# Patient Record
Sex: Female | Born: 1970 | Race: White | Hispanic: No | Smoking: Never smoker
Health system: Southern US, Community
[De-identification: ages and names within clinical notes are randomized; demographics above are authoritative.]

## PROBLEM LIST (undated history)

## (undated) DIAGNOSIS — F4310 Post-traumatic stress disorder, unspecified: Secondary | ICD-10-CM

## (undated) DIAGNOSIS — Z8709 Personal history of other diseases of the respiratory system: Secondary | ICD-10-CM

## (undated) DIAGNOSIS — J45909 Unspecified asthma, uncomplicated: Secondary | ICD-10-CM

## (undated) DIAGNOSIS — M1990 Unspecified osteoarthritis, unspecified site: Secondary | ICD-10-CM

## (undated) DIAGNOSIS — K649 Unspecified hemorrhoids: Secondary | ICD-10-CM

## (undated) DIAGNOSIS — F32A Depression, unspecified: Secondary | ICD-10-CM

## (undated) DIAGNOSIS — R238 Other skin changes: Secondary | ICD-10-CM

## (undated) DIAGNOSIS — F329 Major depressive disorder, single episode, unspecified: Secondary | ICD-10-CM

## (undated) DIAGNOSIS — M2540 Effusion, unspecified joint: Secondary | ICD-10-CM

## (undated) DIAGNOSIS — G4700 Insomnia, unspecified: Secondary | ICD-10-CM

## (undated) DIAGNOSIS — I341 Nonrheumatic mitral (valve) prolapse: Secondary | ICD-10-CM

## (undated) DIAGNOSIS — R51 Headache: Secondary | ICD-10-CM

## (undated) DIAGNOSIS — L853 Xerosis cutis: Secondary | ICD-10-CM

## (undated) DIAGNOSIS — G8929 Other chronic pain: Secondary | ICD-10-CM

## (undated) DIAGNOSIS — R42 Dizziness and giddiness: Secondary | ICD-10-CM

## (undated) DIAGNOSIS — K219 Gastro-esophageal reflux disease without esophagitis: Secondary | ICD-10-CM

## (undated) DIAGNOSIS — Z9289 Personal history of other medical treatment: Secondary | ICD-10-CM

## (undated) DIAGNOSIS — I493 Ventricular premature depolarization: Secondary | ICD-10-CM

## (undated) DIAGNOSIS — R233 Spontaneous ecchymoses: Secondary | ICD-10-CM

## (undated) DIAGNOSIS — G629 Polyneuropathy, unspecified: Secondary | ICD-10-CM

## (undated) DIAGNOSIS — M549 Dorsalgia, unspecified: Secondary | ICD-10-CM

## (undated) DIAGNOSIS — L309 Dermatitis, unspecified: Secondary | ICD-10-CM

## (undated) DIAGNOSIS — F419 Anxiety disorder, unspecified: Secondary | ICD-10-CM

## (undated) DIAGNOSIS — M810 Age-related osteoporosis without current pathological fracture: Secondary | ICD-10-CM

## (undated) HISTORY — DX: Unspecified asthma, uncomplicated: J45.909

## (undated) HISTORY — DX: Age-related osteoporosis without current pathological fracture: M81.0

## (undated) HISTORY — PX: LUMBAR LAMINECTOMY: SHX95

## (undated) HISTORY — PX: KNEE ARTHROPLASTY: SHX992

## (undated) HISTORY — PX: TOTAL SHOULDER ARTHROPLASTY: SHX126

## (undated) HISTORY — PX: BLADDER SURGERY: SHX569

## (undated) HISTORY — PX: OTHER SURGICAL HISTORY: SHX169

---

## 1990-07-21 HISTORY — PX: OTHER SURGICAL HISTORY: SHX169

## 1993-07-21 DIAGNOSIS — R000 Tachycardia, unspecified: Secondary | ICD-10-CM

## 1993-07-21 HISTORY — DX: Tachycardia, unspecified: R00.0

## 1998-07-06 ENCOUNTER — Ambulatory Visit (HOSPITAL_COMMUNITY): Admission: RE | Admit: 1998-07-06 | Discharge: 1998-07-06 | Payer: Self-pay | Admitting: Family Medicine

## 1998-07-06 ENCOUNTER — Encounter: Payer: Self-pay | Admitting: Family Medicine

## 1998-07-18 ENCOUNTER — Ambulatory Visit (HOSPITAL_COMMUNITY): Admission: RE | Admit: 1998-07-18 | Discharge: 1998-07-18 | Payer: Self-pay | Admitting: Family Medicine

## 1998-07-18 ENCOUNTER — Encounter: Payer: Self-pay | Admitting: Family Medicine

## 1999-06-14 ENCOUNTER — Ambulatory Visit (HOSPITAL_COMMUNITY): Admission: RE | Admit: 1999-06-14 | Discharge: 1999-06-14 | Payer: Self-pay | Admitting: Neurology

## 1999-06-14 ENCOUNTER — Encounter: Payer: Self-pay | Admitting: Neurology

## 1999-09-25 ENCOUNTER — Encounter: Admission: RE | Admit: 1999-09-25 | Discharge: 1999-09-25 | Payer: Self-pay | Admitting: Family Medicine

## 1999-09-25 ENCOUNTER — Encounter: Payer: Self-pay | Admitting: Family Medicine

## 1999-09-25 ENCOUNTER — Ambulatory Visit (HOSPITAL_COMMUNITY): Admission: RE | Admit: 1999-09-25 | Discharge: 1999-09-25 | Payer: Self-pay | Admitting: Family Medicine

## 1999-10-18 ENCOUNTER — Encounter: Admission: RE | Admit: 1999-10-18 | Discharge: 1999-10-18 | Payer: Self-pay | Admitting: Neurology

## 1999-10-18 ENCOUNTER — Encounter: Payer: Self-pay | Admitting: Neurology

## 2000-08-17 ENCOUNTER — Inpatient Hospital Stay (HOSPITAL_COMMUNITY): Admission: EM | Admit: 2000-08-17 | Discharge: 2000-08-20 | Payer: Self-pay | Admitting: *Deleted

## 2003-05-02 ENCOUNTER — Encounter: Admission: RE | Admit: 2003-05-02 | Discharge: 2003-05-02 | Payer: Self-pay | Admitting: Neurology

## 2003-05-02 ENCOUNTER — Encounter: Payer: Self-pay | Admitting: Radiology

## 2003-05-02 ENCOUNTER — Encounter: Payer: Self-pay | Admitting: Neurology

## 2003-05-15 ENCOUNTER — Encounter: Admission: RE | Admit: 2003-05-15 | Discharge: 2003-05-15 | Payer: Self-pay | Admitting: Neurology

## 2003-05-15 ENCOUNTER — Encounter: Payer: Self-pay | Admitting: Neurology

## 2003-05-31 ENCOUNTER — Encounter: Admission: RE | Admit: 2003-05-31 | Discharge: 2003-05-31 | Payer: Self-pay | Admitting: Neurology

## 2004-07-21 HISTORY — PX: ANTERIOR FUSION CERVICAL SPINE: SUR626

## 2004-07-30 ENCOUNTER — Ambulatory Visit (HOSPITAL_COMMUNITY): Admission: RE | Admit: 2004-07-30 | Discharge: 2004-07-30 | Payer: Self-pay | Admitting: Neurology

## 2004-08-15 ENCOUNTER — Encounter: Admission: RE | Admit: 2004-08-15 | Discharge: 2004-08-15 | Payer: Self-pay | Admitting: Neurology

## 2004-08-30 ENCOUNTER — Encounter: Admission: RE | Admit: 2004-08-30 | Discharge: 2004-08-30 | Payer: Self-pay | Admitting: Neurology

## 2004-09-06 ENCOUNTER — Ambulatory Visit: Payer: Self-pay | Admitting: Internal Medicine

## 2004-09-09 ENCOUNTER — Ambulatory Visit: Payer: Self-pay | Admitting: Internal Medicine

## 2004-12-17 ENCOUNTER — Inpatient Hospital Stay (HOSPITAL_COMMUNITY): Admission: RE | Admit: 2004-12-17 | Discharge: 2004-12-19 | Payer: Self-pay | Admitting: Specialist

## 2005-01-10 ENCOUNTER — Ambulatory Visit: Payer: Self-pay | Admitting: Internal Medicine

## 2005-04-22 ENCOUNTER — Encounter: Admission: RE | Admit: 2005-04-22 | Discharge: 2005-04-22 | Payer: Self-pay | Admitting: Specialist

## 2005-05-06 ENCOUNTER — Encounter: Admission: RE | Admit: 2005-05-06 | Discharge: 2005-05-06 | Payer: Self-pay | Admitting: Specialist

## 2005-07-21 DIAGNOSIS — Z9289 Personal history of other medical treatment: Secondary | ICD-10-CM

## 2005-07-21 HISTORY — PX: BACK SURGERY: SHX140

## 2005-07-21 HISTORY — DX: Personal history of other medical treatment: Z92.89

## 2005-09-03 ENCOUNTER — Ambulatory Visit: Payer: Self-pay | Admitting: Internal Medicine

## 2005-10-15 ENCOUNTER — Encounter: Admission: RE | Admit: 2005-10-15 | Discharge: 2005-10-15 | Payer: Self-pay | Admitting: Specialist

## 2006-01-05 ENCOUNTER — Ambulatory Visit: Payer: Self-pay | Admitting: Internal Medicine

## 2006-01-06 ENCOUNTER — Ambulatory Visit: Payer: Self-pay | Admitting: Internal Medicine

## 2006-03-11 ENCOUNTER — Encounter: Admission: RE | Admit: 2006-03-11 | Discharge: 2006-03-11 | Payer: Self-pay | Admitting: Specialist

## 2006-04-21 ENCOUNTER — Inpatient Hospital Stay (HOSPITAL_COMMUNITY): Admission: RE | Admit: 2006-04-21 | Discharge: 2006-04-25 | Payer: Self-pay | Admitting: Specialist

## 2006-06-24 ENCOUNTER — Ambulatory Visit: Payer: Self-pay | Admitting: Internal Medicine

## 2006-11-03 ENCOUNTER — Encounter: Admission: RE | Admit: 2006-11-03 | Discharge: 2006-11-03 | Payer: Self-pay | Admitting: Obstetrics and Gynecology

## 2006-11-19 ENCOUNTER — Ambulatory Visit (HOSPITAL_COMMUNITY): Admission: RE | Admit: 2006-11-19 | Discharge: 2006-11-19 | Payer: Self-pay | Admitting: Orthopedic Surgery

## 2006-11-25 ENCOUNTER — Ambulatory Visit: Payer: Self-pay | Admitting: Internal Medicine

## 2007-03-12 ENCOUNTER — Ambulatory Visit: Payer: Self-pay | Admitting: Internal Medicine

## 2007-05-14 ENCOUNTER — Ambulatory Visit: Payer: Self-pay | Admitting: Internal Medicine

## 2007-10-19 ENCOUNTER — Ambulatory Visit: Payer: Self-pay | Admitting: Internal Medicine

## 2007-10-21 ENCOUNTER — Telehealth (INDEPENDENT_AMBULATORY_CARE_PROVIDER_SITE_OTHER): Payer: Self-pay | Admitting: *Deleted

## 2007-11-04 ENCOUNTER — Encounter: Payer: Self-pay | Admitting: Internal Medicine

## 2007-11-04 DIAGNOSIS — J3089 Other allergic rhinitis: Secondary | ICD-10-CM

## 2007-11-04 DIAGNOSIS — J302 Other seasonal allergic rhinitis: Secondary | ICD-10-CM

## 2007-11-04 DIAGNOSIS — J4530 Mild persistent asthma, uncomplicated: Secondary | ICD-10-CM

## 2008-06-01 ENCOUNTER — Ambulatory Visit: Payer: Self-pay | Admitting: Internal Medicine

## 2008-06-02 ENCOUNTER — Ambulatory Visit: Payer: Self-pay | Admitting: Internal Medicine

## 2008-12-29 ENCOUNTER — Ambulatory Visit: Payer: Self-pay | Admitting: Internal Medicine

## 2009-01-05 ENCOUNTER — Telehealth: Payer: Self-pay | Admitting: Internal Medicine

## 2009-01-15 ENCOUNTER — Ambulatory Visit: Payer: Self-pay | Admitting: Internal Medicine

## 2009-01-16 ENCOUNTER — Encounter: Payer: Self-pay | Admitting: Internal Medicine

## 2009-01-25 ENCOUNTER — Telehealth (INDEPENDENT_AMBULATORY_CARE_PROVIDER_SITE_OTHER): Payer: Self-pay | Admitting: *Deleted

## 2009-01-29 ENCOUNTER — Encounter: Payer: Self-pay | Admitting: Internal Medicine

## 2009-01-30 ENCOUNTER — Ambulatory Visit: Payer: Self-pay | Admitting: Internal Medicine

## 2009-02-20 ENCOUNTER — Telehealth (INDEPENDENT_AMBULATORY_CARE_PROVIDER_SITE_OTHER): Payer: Self-pay | Admitting: *Deleted

## 2009-02-20 ENCOUNTER — Ambulatory Visit: Payer: Self-pay | Admitting: Internal Medicine

## 2009-03-15 ENCOUNTER — Telehealth (INDEPENDENT_AMBULATORY_CARE_PROVIDER_SITE_OTHER): Payer: Self-pay | Admitting: *Deleted

## 2009-03-21 ENCOUNTER — Ambulatory Visit: Payer: Self-pay | Admitting: Internal Medicine

## 2009-04-11 ENCOUNTER — Encounter: Admission: RE | Admit: 2009-04-11 | Discharge: 2009-04-11 | Payer: Self-pay | Admitting: Specialist

## 2009-05-07 ENCOUNTER — Ambulatory Visit: Payer: Self-pay | Admitting: Internal Medicine

## 2009-05-15 ENCOUNTER — Ambulatory Visit: Payer: Self-pay | Admitting: Internal Medicine

## 2009-05-25 ENCOUNTER — Encounter: Admission: RE | Admit: 2009-05-25 | Discharge: 2009-05-25 | Payer: Self-pay | Admitting: Specialist

## 2009-09-12 ENCOUNTER — Ambulatory Visit: Payer: Self-pay | Admitting: Internal Medicine

## 2009-10-22 ENCOUNTER — Encounter: Payer: Self-pay | Admitting: Internal Medicine

## 2009-11-15 ENCOUNTER — Ambulatory Visit: Payer: Self-pay | Admitting: Internal Medicine

## 2009-11-16 ENCOUNTER — Ambulatory Visit: Payer: Self-pay | Admitting: Internal Medicine

## 2009-12-07 ENCOUNTER — Encounter: Admission: RE | Admit: 2009-12-07 | Discharge: 2009-12-07 | Payer: Self-pay | Admitting: Specialist

## 2010-03-04 ENCOUNTER — Encounter: Admission: RE | Admit: 2010-03-04 | Discharge: 2010-03-04 | Payer: Self-pay | Admitting: Specialist

## 2010-06-26 ENCOUNTER — Ambulatory Visit: Payer: Self-pay | Admitting: Internal Medicine

## 2010-07-08 ENCOUNTER — Encounter
Admission: RE | Admit: 2010-07-08 | Discharge: 2010-07-08 | Payer: Self-pay | Source: Home / Self Care | Attending: Specialist | Admitting: Specialist

## 2010-07-16 ENCOUNTER — Ambulatory Visit: Payer: Self-pay | Admitting: Internal Medicine

## 2010-08-11 ENCOUNTER — Encounter: Payer: Self-pay | Admitting: Neurology

## 2010-08-20 NOTE — Miscellaneous (Signed)
Summary: Injection Record/[facility]  Injection Record/[facility]   Imported By: Sherian Rein 12/11/2009 11:39:44  _____________________________________________________________________  External Attachment:    Type:   Image     Comment:   External Document

## 2010-08-20 NOTE — Miscellaneous (Signed)
Summary: Injection Orders / [facility]    Injection Orders / [facility]    Imported By: Lennie Odor 12/18/2009 15:46:55  _____________________________________________________________________  External Attachment:    Type:   Image     Comment:   External Document

## 2010-08-20 NOTE — Miscellaneous (Signed)
Summary: Change Nasonex to Fluticasone-insurance wont cover  Clinical Lists Changes  Medications: Removed medication of NASONEX 50 MCG/ACT  SUSP (MOMETASONE FUROATE) 2 sprays each nostril as needed Added new medication of FLUTICASONE PROPIONATE 50 MCG/ACT SUSP (FLUTICASONE PROPIONATE) 1-2 sprays in each nostril up to twice a day - Signed Rx of FLUTICASONE PROPIONATE 50 MCG/ACT SUSP (FLUTICASONE PROPIONATE) 1-2 sprays in each nostril up to twice a day;  #1 x 6;  Signed;  Entered by: Reynaldo Minium CMA;  Authorized by: Waymon Budge MD;  Method used: Electronically to [facility]*, [address] [place], [place]  [id number], Ph: [phone], Fax: [phone]    Prescriptions: FLUTICASONE PROPIONATE 50 MCG/ACT SUSP (FLUTICASONE PROPIONATE) 1-2 sprays in each nostril up to twice a day  #1 x 6   Entered by:   Reynaldo Minium CMA   Authorized by:   Waymon Budge MD   Signed by:   Reynaldo Minium CMA on 10/22/2009   Method used:   Electronically to        [facility]* (retail)       [address]       [place], [place]  [id number]       Ph: [phone]       Fax: [phone]   RxID:   [id number]

## 2010-08-20 NOTE — Miscellaneous (Signed)
Summary: Injection Orders / [facility]    Injection Orders / [facility]    Imported By: Lennie Odor 12/18/2009 14:00:16  _____________________________________________________________________  External Attachment:    Type:   Image     Comment:   External Document
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
## 2010-09-30 ENCOUNTER — Other Ambulatory Visit: Payer: Self-pay | Admitting: Specialist

## 2010-09-30 DIAGNOSIS — M549 Dorsalgia, unspecified: Secondary | ICD-10-CM

## 2010-10-09 ENCOUNTER — Ambulatory Visit
Admission: RE | Admit: 2010-10-09 | Discharge: 2010-10-09 | Disposition: A | Discharge status: Home / Self Care | Payer: Medicare Other | Source: Ambulatory Visit | Attending: Specialist | Admitting: Specialist

## 2010-10-09 DIAGNOSIS — M549 Dorsalgia, unspecified: Secondary | ICD-10-CM

## 2010-12-03 NOTE — Op Note (Signed)
NAME:  Natalie Berg, Natalie Berg               ACCOUNT NO.:  [id number]   MEDICAL RECORD NO.:  [id number]          PATIENT TYPE:  AMB   LOCATION:  SDS                          FACILITY:  [facility]   PHYSICIAN:  Nadara Mustard, MD     DATE OF BIRTH:  08-06-1970   DATE OF PROCEDURE:  11/19/2006  DATE OF DISCHARGE:                               OPERATIVE REPORT   PREOPERATIVE DIAGNOSIS:  1. Right shoulder rotator cuff tear.  2. Impingement syndrome.  3. Grade 1 slap lesion.   POSTOPERATIVE DIAGNOSIS:  1. Right shoulder rotator cuff tear.  2. Impingement syndrome.  3. Grade 1 slap lesion.   PROCEDURE:  Right shoulder arthroscopy with subacromial decompression,  debridement of slap tear as well as debridement of rotator cuff tear.   SURGEON:  Nadara Mustard, M.D.   ANESTHESIA:  General plus interscalene block.   ESTIMATED BLOOD LOSS:  Minimal.   ANTIBIOTICS:  1 gram Kefzol.   DRAINS:  None.   COMPLICATIONS:  None.   DISPOSITION:  To PACU in stable condition.   INDICATIONS FOR PROCEDURE:  The patient is a 40 year old woman with  rotator cuff tear of her right shoulder.  The patient has failed  conservative care.  She has pain with activities of daily living.  MRI  scan confirms a tear, and the patient presents at this time for  arthroscopic intervention.  Risks and benefits were discussed including  infection, neurovascular injury, persistent pain, need for additional  surgery.  The patient states he understands and wishes to proceed at  this time.   DESCRIPTION OF PROCEDURE:  The patient was brought to OR Room 10 and  after undergoing interscalene block.  The patient underwent general  anesthetic.  After adequate level of anesthesia was obtained, the  patient was placed in the beach-chair position, and her right upper  extremity was prepped using DuraPrep and draped into a sterile field.  The scope was inserted through the posterior portal, and an anterior  portal was established with  outside-in technique with am 18-gauge  spinal.  Visualization showed a grade 1 slap lesion as well as partial  tearing of the rotator cuff.  These were debrided with both the shaver  and the TurboVac wand.  The patient had good attachment of the rotator  cuff along the humeral head.  After debridement, the instruments were  removed.  The scope was then inserted from the posterior portal of the  subacromial space, and a lateral portal was established.  The patient  had a very hooked type 3 acromion with very tight anterior subacromial  space.  Using the acromionizer, the patient underwent a subacromial  decompression.  The bursa was also excised.  The TurboVac wand was used  for hemostasis.  The instruments were removed.  The portals were closed  using 2-0 nylon.  The wounds were covered Adaptic orthopedic sponges,  ABD dressing and Hypafix tape.  The patient was placed in a sling,  extubated, taken to the PACU in stable condition.  Plan for discharge to  home.  Follow up in the office in  2 weeks      Nadara Mustard, MD  Electronically Signed     MVD/MEDQ  D:  11/19/2006  T:  11/19/2006  Job:  [id number]

## 2010-12-03 NOTE — Assessment & Plan Note (Signed)
[facility]                             PULMONARY OFFICE NOTE   NAME:Natalie Berg                      MRN:          [id number]  DATE:03/12/2007                            DOB:          Dec 25, 1970    PROBLEMS:  1. Allergic asthma.  2. Allergic rhinitis.   HISTORY:  One year follow up. She had lumbar fusion since last here with  no anesthesia related respiratory problems. She blames ragweed season  over the last two weeks for mild hoarseness. She tried Singulair without  noticing much benefit. She has just now resumed Nasonex. Allergy vaccine  has done well overall at 1-10 with no problems. We did look at that in  relation to her seasonal symptoms.   MEDICATIONS:  1. Advair 250/50.  2. Cymbalta 60 mg b.i.d.  3. Allergy vaccine.  4. Trazodone 50 mg from 1 to 4 nightly.  5. Flexeril 10 mg.  6. Albuterol rescue inhaler.  7. Nasonex.  8. EpiPen.  9. Claritin.  10.Ativan.   ALLERGIES:  No medication allergy.   OBJECTIVE:  Weight 134 pounds, blood pressure 116/72, pulse regular 85,  room air saturation 99%. Mild hoarseness. Throat is not red. No visible  drainage. No strider or adenopathy. Conjunctivae are clear. Chest is  clear. Heart sounds normal.   IMPRESSION:  Allergic rhinitis, mild asthma.   PLAN:  Reflux precautions, environmental precautions were again  reviewed. Try Xyzal 5 mg daily p.r.n. with discussion, comparing to over-  the-counter antihistamines. Continue allergy vaccine. Consider retesting  if necessary. Schedule return 1 year.     Clinton D. Maple Hudson, MD, Tonny Bollman, FACP  Electronically Signed   CDY/MedQ  DD: 03/14/2007  DT: 03/15/2007  Job #: [id number]   cc:   Holley Bouche, M.D.

## 2010-12-06 NOTE — Discharge Summary (Signed)
[facility]  Patient:    Natalie Berg, Natalie Berg                      MRN: [id number] Adm. Date:  [id number] Disc. Date: [id number] Attending:  Otilio Saber Dictator:   Johnella Moloney, NP                           Discharge Summary  HISTORY OF PRESENT ILLNESS:  Mrs. Natalie Berg is a 40 year old Caucasian married female admitted August 18, 2000 on a voluntary basis for depression with suicidal ideation and homicidal ideation.  Patient presents with a history of depression for years.  She reports that she has been feeling depressed, increased over the past week, feeling very tired, hopeless and sad. Contemplating suicide with a plan to cut her wrists.  She states she has never acted on it.  She also is having thoughts of hurting her husband.  They had a small argument and she was having thoughts to stab him.  She states that these thoughts were making her even more depressed.  She knew it was wrong and found that she was obsessing about this.  Sleeping poorly, has chronic back pain that has increased.  Appetite fair with weight loss, but this has been intentional weight loss.  Denies auditory or visual hallucinations, no paranoia.  Does report that she has been having other obsessive behaviors such as checking and rechecking.  She was on Paxil about a year and a half ago, with some improvement shown, but she stopped her medication on her own. Currently denies suicidal ideation.  PAST PSYCHIATRIC HISTORY:  Patient has no previous psychiatric inpatient or outpatient treatment.  PAST MEDICAL HISTORY:  Patients primary care physician is Dr. Tiburcio Pea. Medical problems include asthma, chronic back and neck pain, history of mitral valve prolapse, gastroesophageal reflux disease.  ADMISSION MEDICATIONS: 1. Prilosec 20 mg 1 p.o. q.d. 2. Advair 100/50 1 puff q.12h. 3. Albuterol 1-2 puffs q.4h. p.r.n.  DRUG ALLERGIES:  No known drug allergies.  PHYSICAL EXAM:  Basically  normal with no positive findings.  LAB WORK:  Hemogram was within normal limits.  Routine chemistry was within normal limits with exception of her glucose that was elevated at 116, and also her BUN was low at 5.  T4, T3, and TSH was within normal limits.  Her urinalysis:  Color was amber and appearance was turbid, otherwise it was within normal limits.  HOSPITAL COURSE:  Patient was admitted to [facility] unit for treatment of her depression.  We initially stated that she could use Prilosec 20 mg b.i.d., Advair 100/50 mg 1 puff q.12h., Albuterol inhaler 1-2 puffs p.r.n., Celexa 20 mg p.o. q.a.m. along with Trazodone 25 mg p.o. at h.s. and could repeat in one hour if cannot sleep.  We finally just discontinued the Trazodone and increased it to Trazodone 50 mg p.o. at h.s.  While she was here, on second day she reported feeling better.  Mood and affect were less depressed.  She reports her suicidal and obsessive thoughts of hurting her husband have decreased, sleeping somewhat better, appetite fair.  She had done well with Paxil in the past and is tolerating the Celexa well.  We decided to continue current medications.  There was a family session with the patient and her husband, and history was reviewed along with the discharge plan and patient did discuss longterm childhood stresses which may have precipitated  her symptoms.  She feels like the meds are helping and she is ready for discharge.  She is going to take a leave of absence from work.  The extended family that is living with them will be relocating in about a month and she feels like this will help her.  Strong support from her husband.  On January 31, she was much better, denying suicidal or homicidal thoughts.  She was not obsessing as she was on admission, eating and sleeping well.  Family session went quite well.  Her mood and affect were brighter, and it was felt that she could be managed on an outpatient  basis safely.  CONDITION ON DISCHARGE:   Patient is discharged in improved condition with improvement in her mood, sleep, appetite and alleviation of any suicidal or homicidal ideations or intent, improvement in her energy, tolerating her medications well, and no signs and symptoms of psychosis.  DISPOSITION:  The patient is discharged to home.  FOLLOW UP:  Patient is to follow up with Dr. Jeanie Sewer, i.e., her primary care physician, for any problems related to medical issues, and also she is going to be seeing Dr. Jennelle Human.  DISCHARGE MEDICATIONS: 1. Celexa 20 mg  1 tab q.a.m. 2. Trazodone 50 mg 1 tab h.s. 3. Prilosec 20 mg 1 tab a.m. 4. Advair 100/50 1 puff q.12h. 5. Albuterol 1-2 puffs q.4h. p.r.n. as needed. 6. Z-Pak as directed.  DISCHARGE DIAGNOSES: Axis I:    Major depressive disorder, single episode. Axis II:   Deferred. Axis III:  Asthma and chronic back pain. Axis IV:   Mild, secondary to her occupation and medical problems. Axis V:    Current global assessment of functioning 60, highest past            year 70. DD:  09/17/00 TD:  09/17/00 Job: [id number]

## 2010-12-06 NOTE — H&P (Signed)
[facility]  Patient:    Natalie Berg, Natalie Berg                      MRN: [id number] Adm. Date:  [id number] Attending:  Otilio Saber Dictator:   [place] Lorin Picket, R.N., F.N.P.                         History and Physical  REVIEW OF SYSTEMS:  Constitution:  The patient reports some recent feeling of malaise with low grade fever for about 10 days, generally feeling achy all over accompanied by nasal congestion and some occasional mild headache, no chills or night sweats.  Prior to this, the patient reports being essentially with no severe infections over the past year.  Cardiovascular: The patient reports a history of mitral valve prolapse from which she has occasional palpitations; otherwise, no particular chest pain, no history of syncope, no history of swelling in extremities.  Respiratory: The patient is a nonsmoker, currently using albuterol and Advair Diskus for asthma, reports occasional wheezing but no particular triggers noted.  The patient uses her albuterol usually not more than once daily but usually less than daily, once every two to three days.  Musculoskeletal: The patient has a history of lumbar disk disease and is status post laminectomy.  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  PHYSICAL EXAMINATION:  Young white female, casually dressed, appearing to be her stated age, well-nourished, well-developed in no distress.  Relaxed and cooperative.  Vital signs today: Temperature 99.7, pulse 101, respirations 24, blood pressure 124/76.  Skin is pale in tone and smooth.  Skin turgor is good. Skin is without rashes or remarkable markings, no tattoos.  Fine, thick strawberry blonde  hair; hair distribution normal for age and sex.  Head is normocephalic without lesions.  Eyes: Lids are without edema.  Conjunctivae and sclerae are clear.  Pupils equal, round and reactive to light.  Red reflex is noted but fundi not visualized.  ENT: External ear canals are patent and TMs have normal light reflex bilaterally.  Hearing is intact to whispered voice.  Slight facial tenderness over her left frontal sinus and her left maxillary sinus.  Nasal mucosa is pink and moist with 1+ swelling of the turbinates on the right and a moderate amount of pale yellow mucous discharge. Oral mucosa is pink and moist.  Tongue is midline without fasciculations. Uvula is midline.  Pharynx shows pale yellow mucous coating.  Neck is supple, no evidence of thyromegaly, no thyroid masses noted.  AC and PC nodes are 1+ bilaterally.  Respiratory effort is easy.  Lungs are clear to auscultation bilaterally.  Extremities are pink and warm.  Cardiovascular: S1 and S2 heard, pulses of regular rate and rhythm without clicks, murmurs, or gallops noted. Carotid pulses are 2+ bilaterally.  Peripheral pulses are 2+.  Lower extremities are without varicosities or edema.  No abdominal bruits heard. Chest is symmetrical.  Breast: Deferred.  GI: Bowel sounds present in all four quadrants, no hepatomegaly, no masses or tenderness.  Genitalia is deferred. Musculoskeletal: Upright posture, spine is straight.  Range of motion of her neck is limited in rotation to the  left.  Flexion and extension are full. Full range of motion in her upper extremities and full range of motion in her lower extremities, although she tends to guard somewhat on the range of motion in her left shoulder and complains of some tenderness of palpation of the left shoulder with range of motion movements.  Strength is 5/5 in lower extremities.  Strength is 5/5 in upper extremities but a slight motor weakness noted with the left arm both.  Grip  strength is equal bilateral extremities. Gait is a full stride; ambulatory without devices.  Neurologic: Cranial nerves II-XII are intact.  Extraocular movements are intact without nystagmus. Biceps, Achilles, and patellar reflexes are 2+/5.  Babinski is downgoing. Sensation is intact.  Motor movements are smooth without tremor.  Romberg is negative.  Cerebellar function is intact for heel-to-shin maneuvers and rapid alternating movements. DD:  08/18/00 TD:  08/18/00 Job: [id number]

## 2010-12-06 NOTE — Discharge Summary (Signed)
NAMEJAMISHA, HOESCHEN               ACCOUNT NO.:  [id number]   MEDICAL RECORD NO.:  [id number]          PATIENT TYPE:  INP   LOCATION:  5016                         FACILITY:  [facility]   PHYSICIAN:  Kerrin Champagne, M.D.   DATE OF BIRTH:  [birth year]   DATE OF ADMISSION:  04/21/2006  DATE OF DISCHARGE:  04/25/2006                                 DISCHARGE SUMMARY   ADMISSION DIAGNOSIS:  1. Degenerative disk disease, L4-5 and L5-S1.  2. Asthma.  3. Gastroesophageal reflux disease.  4. Anxiety and depression.  5. Mitral valve prolapse.  6. Chronic bronchitis.  7. Cardiac arrhythmia, probable supraventricular tachycardia.  8. Status post cervical fusion 2006   DISCHARGE DIAGNOSIS:  1. Degenerative disk disease, L4-5 and L5-S1.  2. Asthma.  3. Gastroesophageal reflux disease.  4. Anxiety and depression.  5. Mitral valve prolapse.  6. Chronic bronchitis.  7. Cardiac arrhythmia, probable supraventricular tachycardia.  8. Status post cervical fusion 2006  9. Posthemorrhagic anemia requiring blood transfusion.  10.Episode of supraventricular tachycardia requiring stay in the telemetry      unit.  Stable at discharge.   PROCEDURE:  On April 21, 2006, the patient underwent:  1. Left transforaminal lumbar interbody fusion L4-5 and L5-S1,      posterolateral fusion with Monarch pedicle screws and rods.  2. Intraoperative neural monitoring.  3. Cell saver use during the procedure.   This was performed by Dr. Otelia Sergeant, assisted by Maud Deed, [facility], under  general anesthesia.   CONSULTATIONS:  Cardiology.  She was seen by Dr. Elsie Lincoln for the cardiology  group.   BRIEF HISTORY:  The patient is a 40 year old white female with several years  history of low back pain.  She has had multiple lumbar decompressions.  She  now has bilateral leg pain and numbness.  She is unrelieved with narcotic  analgesics and has been using prednisone on a fairly regular basis to  decrease her symptoms.  She has  known to severe degenerative disk disease at  the L4-5 level and the L5-S1 level.  It was felt she would benefit from  surgical intervention and was admitted for the procedure as stated above.   BRIEF HOSPITAL COURSE:  The patient tolerated the procedure under general  anesthesia without complications.  Postoperatively, she did have a drop in  her hemoglobin to 8.1 with hematocrit 24.2.  She received 2 units of  autogenous left.  She went on to further have anemia with hemoglobin 8.4 and  hematocrit 25.1; however, this was noted to be stable at this count, and she  was started on iron supplementation as opposed to a blood transfusion.   On the first postoperative day, Hemovac drain was discontinued.  Dressing  changes were done daily thereafter, and the patient's wound was found to be  healing well.  The patient had some difficulty with constipation but was  able to have bowel movement with stool softeners.  Her diet was then able to  be increased as tolerated.   On the second postoperative day, the patient developed heart racing and  diaphoresis.  The nursing  staff assisted the patient and found her pulse  rate to be ranging from 194-200 with blood pressure 82/53.  EKG was  performed.  Cardiology was notified to see the patient.  The patient was  seen and given medications to normalize her rate.  She was transferred to  telemetry where she was monitored for the next couple of days.  It was felt  that her anemia could be exacerbating the irregularity; however, her anemia  was stabilized, and the patient did not require further blood fusion.  While  on the telemetry floor, her tachycardia essentially resolved, and she  returned to a rate around 90-100.  Otherwise, she continued to do quite  well.  Physical therapy assisted her with ambulation and gait training.  She  learned to don and doff her Aspen brace independently prior to discharge.  Occupational therapy assisted with ADLs, and she  was independent at  discharge with these as well.  PCA medications were discontinued, and she  was able to take oral analgesics without difficulty.  Neurovascular function  of the lower extremities remained intact throughout the hospital stay.  The  patient was able to void once her catheter was discontinued, and a  urinalysis was noted to be negative for urinary tract infection.  On April 25, 2006, the patient was stable for transfer to home.   PERTINENT LABORATORY VALUES:  Hemoglobin and hematocrit as stated above.  Chemistry studies on admission were within normal limits.  Postoperatively,  she had one episode of hypokalemia at 3.3 which normalized to 4.0 prior to  discharge.  Glucose ranged from 82-144.  BUN dropped to 30 postoperatively  but normalized at 8 prior to discharge.  Calcium decreased as low as 7.9 and  was noted to be 8.0 at discharge.  One round of cardiac markers were  performed secondary to her acute onset of tachycardia.  CK was noted to be  287, but troponin was within normal limits.   EKG on October 1 preoperatively showed normal sinus rhythm with premature  atrial ectopic beats.  No significant change since last tracing.  Repeat on  October 4 showed a supraventricular tachycardia, diffuse ST-T wave changes,  possibly rate related versus ischemia.  This was confirmed by Dr. Jenne Campus.   PLAN:  The patient was discharged to home.  Arrangements made for home  health physical therapy, occupational therapy and durable medical equipment.  She was encouraged to wear her brace at all times when she is out of bed.  No lifting, bending or twisting.  She will change her dressing daily and  will be allowed to shower.  She is encouraged to eat potassium-rich foods.  She will walk with assistance. No driving or lifting and increase her  activity slowly.   MEDICATIONS AT DISCHARGE: 1. Percocet 5/325 one to two every 4-6 hours as needed for pain.  2. Robaxin 500 mg one every 8  hours as needed for spasm.  3. Trinsicon one p.o. b.i.d..  4. She is advised to use over-the-counter stool softener daily and      laxatives if needed.  5. She is to avoid Advil, ibuprofen and Aleve products.   She will follow up with Dr. Otelia Sergeant 2 weeks from her surgery.  She was advised  to follow up with Dr. Alanda Amass for her tachycardia by calling his office  and arranging an appointment.   CONDITION ON DISCHARGE:  Stable.      Wende Neighbors, P.A.  Kerrin Champagne, M.D.  Electronically Signed    SMV/MEDQ  D:  05/18/2006  T:  05/18/2006  Job:  [id number]

## 2010-12-06 NOTE — Op Note (Signed)
NAMECAYLOR, CERINO               ACCOUNT NO.:  [id number]   MEDICAL RECORD NO.:  [id number]          PATIENT TYPE:  INP   LOCATION:  5006                         FACILITY:  [facility]   PHYSICIAN:  Kerrin Champagne, M.D.   DATE OF BIRTH:  February 28, 1971   DATE OF PROCEDURE:  12/17/2004  DATE OF DISCHARGE:                                 OPERATIVE REPORT   PREOPERATIVE DIAGNOSES:  This patient has central cervical canal stenosis  and right-sided canal stenosis at the C4-5 level, affecting her right C5  nerve root.  Central canal stenosis at the C5-6 level.   POSTOPERATIVE DIAGNOSES:  This patient has central cervical canal stenosis  and right-sided canal stenosis at the C4-5 level, affecting her right C5  nerve root.  Central canal stenosis at the C5-6 level.   PROCEDURE:  Anterior cervical diskectomy and fusion C4-5 and C5-6, with  decompression of bilateral C5 nerve roots and bilateral C6 nerve roots.  Right iliac crest bone graft harvest through separate incision for both  segments. Internal fixation from C4-C6, utilizing a DePuy 45 mm six-hole  locking plate and 13 mL screws.   SURGEON:  Kerrin Champagne, M.D.   ASSISTANT:  Maud Deed, [facility].   ANESTHESIA:  GOT.   ANESTHESIOLOGIST:  Maren Beach, M.D.   ESTIMATED BLOOD LOSS:  75 cc.   DRAINS:  Foley to straight drain, 10-French TLS drain anterior neck.   HISTORY OF PRESENT ILLNESS:  The patient is a  40 year old female with a  history of severe neck pain with radiation to the right arm.  This had been  present for years, worsening over the last one year. Her studies have  indicated right C5 nerve root entrapment, secondary to posterior inferior  lip osteophytes at the C4-5 level, affecting the right C5 nerve root with  central canal stenosis at this segment as well as at the C5-6 level. The  patient is brought to the operating room after attempts at conservative  management, including steroid injection treatment therapy, have  been  unsuccessful in relieving her pain. She is on high doses of Cymbalta and is  complaining of severe central and right-sided neck and arm pain.   INTRAOPERATIVE FINDINGS:  The patient was found to have central right-sided  cervical stenosis, affecting primarily the right C5 nerve root at the C4-5  level; central stenosis at C5-6 level. 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   DESCRIPTION OF PROCEDURE:  After adequate general anesthesia, placed the  patient in beach-chair position with the head held on the well-padded  [place] horseshoe in slight extension, the bump under the right buttock to  elevate the right iliac crest and the 5 pounds cervical halter traction. The  anterior neck and right iliac crest were prepped with DuraPrep solution and  draped in the usual manner;  iodine VyDrape was used.  Latex precautions  used throughout the case because of the patient's latex allergy. Latex-free  gloves were used, as were all latex-free material used during the procedure.  The patient had marking of the left side of the neck in line with the  patient's skin crease, at the expected C5 level. Also over the right iliac  crest, after sterile prep and draping, the patient underwent infiltration of  these areas using Marcaine 0.5% with 1:200,000 epinephrine;  5 cc into the  cervical spine and  5 cc over the right iliac crest.  Incision made over the  left anterior neck at the C5 level, using a 10 blade scalpel in line with  the patient's skin crease at the level of thyroid cartilage through the  subcutaneous layers. Bleeders controlled using electrocautery. The platysma  layer then incised using electrocautery. The small superficial vein was  suture ligated over the posterior aspect of the incision at lateral aspect.  Carefully the  fascial layers were then spread between the trachea and  esophagus medially, and carotid sheath laterally.  Blunt dissection then  used to carefully expose the anterior aspect of cervical spine. Prevertebral  fascia was cauterized using bipolar electrocautery and  teased across the  midline with Kittner dissector,exposing the anterior aspect of the cervical  spine.  Carotid tuberosity was palpated and this was judged to be the C6  level.  Immediate disk above this area was then marked using a spinal needle  with sheath intact, only allowing a centimeter to protrude anteriorly.  A  second needle then placed at the next level above. Intraoperative lateral  radiograph demonstrated these needles of the C4-5 level and C5-6 level.  While the intraoperative radiograph was being developed, right iliac crest  bone graft harvest site was carefully exposed.  An incision about 3-4 inches  posterior to the anterior superior iliac spine, and  incision made directly  over the superficial portion of the iliac crest anterolaterally. The  incision about 3.5-4 cm in length through skin and subcutaneous  layers down  to the iliac crest;  the abdominal fascia then carefully incised at its  intersection with the proximal thigh fascia over the iliac crest laterally,  and then subperiosteal dissection  taken over the superior aspect of the  crest and medial exposure as well as lateral exposure obtained using a Cobb  elevator. This area was then packed for later exposure and removal of graft.   After identification of the C4-5, C5-6 level, careful handheld traction  retractors were used. The lower C5-6 needle was first removed and a small  portion of the anterior aspect of the disk excised using a 15 blade scalpel,  with pituitary at the second level. The upper C4-5 level was then exposed and the needle removed at this level, and then small portion of disk excised  for careful exposure of this level and marking for  the remainder of the  case.  Carefully, the longus colli muscle was developed both on the left and  right side using subperiosteal dissection as well as electrocautery. There  was quite a bit of venous bleeding in this patient's area of her longus  colli muscle at its intersection with the vertebral body -- both sides  requiring cauterization. After this was developed, then the [facility]  retractors could be placed.  The __________  retractor was used and  articulated arm to allow for its placement in this patient's neck. Laser  then carefully placed beneath the medial border of the longus colli muscle,  and retraction obtained over the C5-6 level. Then 14  mm screw posts were  then placed into the central portion of the vertebral body of C5 and of C6,  and distraction obtained across the anterior aspect of the disk space. A 15  blade scalpel used to incise the remaining portion of the anterior annulus  fibrosis, and pituitary used to excise the disk. Curettes then used to  curettage the end plates of cartilaginous end plates, as well as the  cartilage debris. The intervertebral disk was also excised and felt to be  degenerated. A high-speed bur then used to carefully bur the endplates back  to the posterior lip osteophytes. These were carefully thinned.  Loupe  magnification headlamp was used for initial portion of this procedure, and  then the operating room microscope was carefully draped sterilely and  brought into the field under the operating room microscope.  Then the  posterior lip osteophytes were resected, as was the posterior annulus  resected and the posterior longitudinal ligament resected -- decompressing  the cervical canal centrally.  Foraminotomy performed over both C6 nerve  roots, carefully decompressing these. The height of the intervertebral disk  space and then measured,  and a #7 Green trial with a DePuy cervical set was  used; 8 mm dual oscillating saw was used to  cut the graft and the right  iliac crest. This was done, carefully retracted the medial  and lateral  structures using a dual oscillating saw to make the cut;  then dividing the  cut over its base using a 1/4-inch osteotome. This graft was then carefully  tapered to match this intervertebral disk space.   Careful inspection of disk space demonstrated no bony debris or soft tissue  remaining within the disk space, to be retropulsed with insertion of graft,  and graft was then carefully placed.  Note that the depth of the  intervertebral disk space was measured at 15 mm using a Cloward depth cage.  The graft itself measured approximately 12-13 mm, and this allowed for some  subset in the graft with its insertion impaction into place. Screw post was  then removed at both the C5 and the C6 level. Carefully the bone wax was applied to bleeding screw post holes. Hemostasis was obtained here. The  patient then had exposure of the C4-5 level by placement of the [facility] retractor, with the foot of the blade beneath the medial border  of the longus colli muscle at the C4-5 level; reinserting the blades and  this self-retaining retractor. Careful exposure here and the anterior aspect  of disk was well seen. Under the operating room microscope then 14 mm screw  posts were then replaced  at C5 level, and a second one parallel to the  first at C4 level. Distraction obtained across disk space; a 15 blade  scalpel used to incise the anterior annular fibers, and pituitary rongeur  used to excise this  3 mm; and 2 mm Kerrison used to debride anterior lip  osteophytes. End plates and disk space were then curettaged using the #2  microcurette, as well as 3-0  microcurettes. The endplates were debrided of  cartilaginous material down to bleeding bone endplates.   The dissection then carried posteriorly to the posterior lip osteophytes.  High-speed bur then used to carefully thin the posterior lip  osteophytes  transversely, as well as to carefully parallel the endplates back to  bleeding bony endplates. The 1 mm Kerrison then able  to resect the  posterior lip osteophytes, these at  their base near the more central portion  of vertebral body of C4 and C5. On the right side, particular amount of  osteophyte was found to be present compressing the right side of the thecal  sac and the cervical cord at the C5 level, just above the C5 nerve root  takeoff. This was resected, decompressing the right C5 nerve root disk  material and further fibrous material overlying the right C5 nerve root;  where it was resected using 1-2 mm Kerrison's. Additional osteophyte over  the posterior superior lip of C5 was resected out to the right side,  entering the right C5 neural foramen using one 2 mm Kerrison's and then  downbiting 3-0 microcurette. This completed decompression of the central  portions of the canal, right neural foramen as well as left neural foramen.  Foraminotomy performed over the left C5 nerve root as well.   The intervertebral disk space was carefully irrigated and hemostasis  obtained using thrombin-soaked Gelfoam appropriately.  Gelfoam was then  removed. The height of the intervertebral disk space measured to be #7,  Green sounder was used and the 8 mm dual oscillating saw used to further cut  crest on the right side. This was posterior to the previous cut, to allow  for thick crest. Again, protecting medial and lateral soft tissue structures  to the iliac crest using appropriate retractors, then the oscillating saw  was used to incise a crescent and space divided using 1/4-inch osteotome.  This was carefully tapered to dimensions of the intervertebral disk space;  again using a Theatre manager.  The depth of the intervertebral disk space measured again a 15 mm using a Cloward depth gauge. The graft was then  carefully placed over the disk space.  This required the removal of  cortex  over one of the lateral aspects of the graft, in order for the graft to be  inserted; as the mediolateral dimensions of the intervertebral disk space  were small. With this, the graft measured a depth of about 12 mm to 13 mm  and a height of 8 mm. It was keyed appropriately, the graft then inserted  and impacted into place and subset appropriately. Screw posts were then  removed at C4 and C5 levels. Then 5 pounds cervical halter traction was  released. The patient had bone wax applied to bleeding screw post holes, at  both the C4-C5 level for hemostasis purposes. Carefully the height of the  expected length of the plate was then chosen, using bone wax coated on  cottonoid string applied across the anterior aspect cervical spine from the  lower third of the vertebral bodies,  C4 to the upper third of C6. This  measured about 40 mm as the expected length between screw holes. A 45 mm  plate was chosen.  This was carefully positioned over the anterior aspect of  the cervical spine. It was felt to be rocking slightly, so the high-speed  bur was then used carefully to further remove anterior lip osteophytes of  the C4 and C5 intervertebral disk space level anteriorly, as well as C5-6  anteriorly.   Irrigation was performed. Hemostasis obtained again using electrocautery and  bone wax as needed. The plate was then carefully positioned again along the  anterior aspect of the cervical spine at the patient's chin to midline. This  was then carefully pinned into place, using retaining pins at the upper and  lower level.  First screws placed were at the C5 level  of the left side and  right side, using 13 mm screws.  A 12 mm drill bit was used to drill, using  the centralizer and sleeve protector. With a positive stop, this only  allowed 12 mm to be inserted with the drill. Then a 13 mm screw was able to  be placed on the left side similarly; then drilling with a 12 mm and placing  a 13 mm  screw on the right side at C5. Retaining pins were then removed at  the C4 and at the C6 level. Then at the C4 level 13 mm screws were placed,  first the left side then the right side; again, drilling with the 12 mm  drill and placing appropriate screw left side and then the right side. Then  at the C6 level left side and then right side drilling of 12 mm drill and  then placing 13 mm screws.   This completed fixation with the plates and screws. The final locking nuts  were then each turned, using the appropriate flat screwdriver. These locked  the plate to the screws at each segment. Intraoperative lateral radiograph  demonstrated plates and screws in good position and alignment. No evidence  of retropulsion of the screws or bone graft material noted. Appeared to be  excellent fixation of the C4 through C6 levels. Irrigation was performed in the anterior aspect of the neck. Careful inspection of the esophagus  demonstrated no abnormalities.  A 10-French TLS drain was then carefully  placed in the depth of the wound, exiting out over the anterior aspect of  cervical spine through a separate incision; this then sewn in place with a 4-  0 nylon suture. The left neck incision after careful inspection was then  closed by approximating platysma layer with interrupted 3-0 Vicryl sutures,  the subcutaneous layers with interrupted 3-0 Vicryl sutures and skin closed  with a running subcutaneous  stitch of 4-0 Vicryl.  Tincture of benzoin and  Steri-Strips applied.  At the right iliac crest bone graft harvest site;  Careful hemostasis with bone wax applied to bleeding cancellus bone  surfaces.  Soft tissue cauterized and were necessary to control small  bleeders. The fascial layer of the abdomen then approximated to the upper  thigh fascia with interrupted #1 Vicryl sutures.  The deep subcutaneous  layers approximated with interrupted 0-0 and 2-0 Vicryl suture, and the skin  closed with a running  subcutaneous stitch of 4-0 Vicryl; Tincture of benzoin  and Steri-Strips applied here. Then 4x4s applied to the skin over the right  iliac crest and anterior neck.  Then Hypafix tape used to affix these to the  skin.  The patient's TLS drain was charged with a red top tube. A  [place] collar was applied. The patient was then reactivated,  extubated, returned to recovery room in satisfactory condition. All  instrument and sponge counts were correct.       JEN/MEDQ  D:  12/17/2004  T:  12/17/2004  Job:  [id number]

## 2010-12-06 NOTE — Discharge Summary (Signed)
[facility]  Patient:    Natalie Berg, Natalie Berg                      MRN: [id number] Adm. Date:  [id number] Disc. Date: [id number] Attending:  Otilio Saber Dictator:   Johnella Moloney, N.P.                           Discharge Summary  NO DICTATION. DD:  09/17/00 TD:  09/17/00 Job: [id number]

## 2010-12-06 NOTE — Op Note (Signed)
Natalie Berg, Natalie Berg               ACCOUNT NO.:  [id number]   MEDICAL RECORD NO.:  [id number]          PATIENT TYPE:  INP   LOCATION:  5003                         FACILITY:  [facility]   PHYSICIAN:  Kerrin Champagne, M.D.   DATE OF BIRTH:  08/06/1970   DATE OF PROCEDURE:  04/21/2006  DATE OF DISCHARGE:                                 OPERATIVE REPORT   PROCEDURES:  1. Left transforaminal lumbar interbody fusion, L4-5 and L5-S1 with 7-mm      lordotic DePuy Leopard cages with local bone graft.  2. Posterolateral fusion, L4-5 and L5-S1, with local and Symphony bone      graft material and internal fixation, L4 to sacrum with Monarch pedicle      screws and rods.  3. Intraoperative neural monitoring.  4. Cell Saver used during the case.   SURGEON:  Kerrin Champagne, M.D.   ASSISTANT:  Wende Neighbors, P.A.-C.   ANESTHESIA:  General via orotracheal intubation, Dr. Arta Bruce,  supplemented with local infiltration with Marcaine 0.5% with 1:200,000  epinephrine, 20 cc.   SPECIMENS:  None.   ESTIMATED BLOOD LOSS:  250 cc.   COMPLICATIONS:  None.   NEURAL MONITORING:  The patient had intraoperative neural monitoring  performed, the right-sided L4 screw at 21, the right L5 at 25, the right S1  at 30 or greater, the left at 22 at L4, at the left L5, 25, and at the left  S1, greater than 30.  The patient did have some slight increased potentials  in the left anterior tibialis with placement of cages; however, neural  monitoring indicated that this returned to normal before the end of the  case.   INTRAOPERATIVE FINDINGS:  Severe degenerative disk disease with disk space  collapse of both L4-5 and L5-S1.  She underwent removal of the disk material  from the L4-5 level and L5-S1 level from the left side, with placement of  TLIF cages.   DESCRIPTION OF PROCEDURE:  After adequate general anesthesia, the patient  had a Foley catheter placed.  Standard preoperative antibiotics.  Care  taken, as the patient was known to have Latex-contact allergy.  She was  placed in a prone position on chest rolls, pillows under the thighs and  legs, well padded.  TED hose and PAS stockings.  The arms at the sides at 90  degrees from the body and at the elbow to prevent any significant nerve  traction.  She had blood drawn for purposes of Symphony bone graft material  prior to turning to prone.  She underwent a standard prep with DuraPrep  solution from the mid-dorsal level to the mid-sacral left ventricle and was  then draped in the usual manner.  Iodine Vi-Drape was used.  The old  incisional scar that she had was marked out and ellipsed at the beginning of  the case following infiltration of the skin with subcu Marcaine with  1:200,000 epinephrine; 20 cc used.  The incision then made down to the  lumbodorsal fascia.  The incision then made on both sides of the spinous  processes,  extending from L2 and L3 to L4, L5, S1 and then S2 in the midline  on both sides of the spinous processes, preserving the spinous processes and  interspinous ligaments.   A Cobb was used to elevate the paralumbar muscles, both sides preserving the  facet capsules at the L3-4 level, and the L4-5 facet capsules were resected,  as well as at L5-S1 bilaterally.  An incision was then made out laterally,  exposing the transverse processes of L4 at the L3-4 level, L5 at L4-5 and  the sacral ala bilaterally.  Electrocautery used to control bleeders.   Paralumbar muscles were debrided, where they were felt to be devascularized,  and also to allow for making of a pocket for the placement of bone graft  material between the transverse processes at L4, L5 and S1 and the sacral  ala.  Again, bipolar electrocautery was used to control bleeders, as well as  monopolar.  Each of the transverse processes were well defined at each  segment, packed with sponges on both sides.  An osteotome was then used to  resect the  left-sided inferior articular process of L4 and L5.  This was  done after first performing a C-arm fluoro view lateral, demonstrating the  L5-S1 and L4-5 disk spaces and clamps on both the spinous processes of L5  and L4.  These were marked with a small removal of bone from the posterior  aspect of the spinous processes of L5 and L4 with a bare rongeur.  Using the  osteotome, then resection was performed of the inferior articular processes  of L4 and L5 on the left side, removing a small portion of the inferior  aspect of the lamina on the left side at L4 and L5.  A 3-mm Kerrison was  used to further resect upward the lamina until the insertion of the  ligamentum flavum was encountered and detached from the laminae at L4 and  L5's inferior aspect of the lamina.  Osteotomes were then used to  osteotomize the medial facet at the L4-5 facet on the left side, the  superior articular process of L5 and the superior articular process of S1.  Then, Kerrisons were used to remove this, along with ligamentum flavum,  performing foraminotomy over the left side at the L5 and S1 nerve roots, and  then foraminotomy at the left L4 neural foramina and L5 neural foramina,  removing the superior articular processes of L5 and S1 on the left side back  to the superior aspect of the pedicles to allow for easier exposure of the  disk space and the posterolateral corner for the TLIF procedure.  Bleeders  were controlled using bipolar electrocautery in this area.  Gelfoam,  thrombin soaked, placed.  The C-arm fluoro, which had been draped sterilely  were brought into the field initially for localization and used to help  localize for placement of the awl into the lateral aspect of the pedicle at  the intersection of the transverse process of L4 with the superior articular  process of L4 on the left side initially.  A hand-held pedicle finder was then used to probe the pedicle on the left side to 40 mm.  This was  then  tapped.  At each step, a ball-tipped probe was used to probe the pedicle  into the pedicle, insuring patency and no evidence of broaching of cortex.  Next, the pedicle at the L5 level was localized using an awl for an entry  point, using  the C-arm fluoro in lateral position to ascertain the correct  position and alignment of the awl at the intersection of the transverse  process of L5 with the lateral aspect of the pedicle and the superior  articular process of L5.  A straight pedicle probe was then used to probe  the pedicle at 40 mm as well.  Then, at the S1 level on the left side, an  opening made into the area just inferior to the superior articular process  of S1 in the dimple that is there and just lateral to this area.  An awl was  then used to make an initial opening.  C-arm fluoro was again used to  ascertain correct position and alignment of this, and then the pedicle was  probed at the S1 level, using the straight awl to 35 mm.  Tapping was then  performed to a 6.25 tap at each segment, again, probing the pedicle  following tapping to insure patency of the pedicle and no broaching of  cortex present.  None was found to be present.  Decortication was then  carried out over the transverse process on the left side of L4, L5 and the  sacral ala on the left side.  Bone graft with Symphony bone graft was then  applied, and then 7 x 40-mm screws were placed on the left side at L4, the  left side at L5, and then at the S1 level a 35-mm x 7 screw was placed.  Each of these obtained excellent purchase.  They were placed in the correct  degree of lordosis, as well as convergence, and observed on the C-arm  fluoro.  Each of the fasteners to these screws were then loosened using the  fastener loosener provided.  Attention was then turned to the right side,  where, again, the process of pedicle screw placement was performed.  The  area of the insertion of the transverse process of L4  with the superior  articular process of L4 and the lateral aspect of the pedicle probed, using  an awl for an entry point and C-arm fluoro to ascertain the correct position  and alignment of this.  The hand-held pedicle finder was then used to probe  the pedicle without difficulty.  The ball-tipped probe was used to probe the  pedicle to insure patency and no evidence of broaching of cortex.  It  measured 40 mm in depth, and tapping with a 6.25 tap, decorticating the  transverse process.  Bone graft was then applied to the transverse process  and a 7 x 40-mm screw was then placed on the right side at L4.  The correct  degree of convergence and lordosis were obeyed.  The C-arm fluoro indicated  a well-placed pedicle screw on the right at L4.  Next, at the L5 level on  the right side, an awl was again used to make an entry point into the  lateral aspect of the pedicle at the intersection of the transverse process of L5 with the superior articular process of L5 on the right side.  It was  observed on C-arm fluoro to be in good position and alignment.  Hand-held  pedicle probe was then used to probe the pedicle.  A ball-tipped probe was  then used to probe the pedicle channel to insure patency of the channel and  to insure that there was in broaching of cortex over the vertebral body, as  well as within the pedicle, and none was present.  Tapping performed with a  6.25 tap, the correct degree of convergence and lordosis maintained.  The  transverse process of L5 was then carefully decorticated with a high-speed  bur.  Symphony bone graft placed over this area.  Then, a 7 x 40-mm screw  placed into the pedicle on the right side at L5 without difficulty.  Next,  at the right S1 level, an awl was then placed at the inferior aspect of the  superior articular process of S1 at the dimple present just below the  superior articular process here and lateral to the process.  Then, using the  awl, the  C-arm fluoro was used to ascertain correct position and alignment.  With this, then, the S1 pedicle was probed using a straight probe to 35 mm.  Tapping was then performed using a 6.25 tap, probing the pedicle with a ball-  tipped probe to insure patency and no sign of loss or broaching of cortex  present.  Next, decortication was carried out over the lateral aspect of the  ala at the S1 level on the right side.  Symphony bone graft was then placed  over the outer side of the ala on the right side, and then between the  transverse processes at L4-L5 and L5-S1.  The 35-mm x 7 screw was then  placed on the right side at the S1 level with the correct degree of  convergence and maintaining lordosis.  The fasteners to the heads were each  loosened appropriately on the right side.  Pedicle screw testing was then  carried out using the neural monitoring provided.  Each of the resistances  tested were greater than 20 at all the screws and greater than 30 at the  sacral screws bilaterally, indicating adequate maintenance of the soft  tissue resistance and suggesting there was no evidence of broaching of  cortex present.  With this, then, a 65-mm in length rod was then placed into  the screw fasteners on the right side.  Each of the fastener caps were then  placed on the right side at L4, L5 and S1 without difficulty.  The rod was  felt to be at the limits of correct length, so that the left-sided rod was  not placed until the end of the case.  Attention, then, turned to placing  and performing TLIF on the left side.  The thecal sac and the L5 nerve root  were then carefully retracted at the L5-S1 level on the left side using  bipolar electrocautery to control bleeders and epidural veins on the left  side.  The disk space was entered using a 15 blade scalpel.  An osteotome  was then used to resect a small portion of the bone over the posterior aspect of the disk space of the posterior superior aspect  of S1 to allow for  entry into the disk using a 1/4-inch osteotome, carefully protecting the  nerve roots.  Pituitary rongeurs were then able to enter the disk space and  remove disk material using straight and upbiting pituitaries with teeth.  The disk was then dilated to 7 mm, and then to 8 mm.  The disk space was  then able to be curettaged of degenerative disk material and the endplates  curetted of cartilaginous endplate material down to bleeding bone surfaces.  The patient had very little disk remaining at this segment, or at the L4-5  level.  Curettage of the endplates was carried out using the straight curet,  the upbiting to the right and the upbiting to the left curets, debriding the  endplates down to bleeding bone surfaces.  Additionally, a straight ring  curet and the forward-biting ring curet were used to debride the disk space  further at the S1 level.  Pituitary rongeurs were used to debride the disk  space of any remaining disk material.  Sounding the disk, and then trialing  with a 7-mm trial, then an 8-mm trial.  A 7-mm lordotic cage was chosen.  The disk space was felt to be well decorticated at this point.  Bleeders  were again controlled using bipolar electrocautery.  The disk space had  local bone graft placed within the disk space via the opening into the disk  on the left side.  This was impacted into place using a 7-mm trial cage.  Once this was completed, then the 7-mm lordotic cage packed with local bone  graft was then inserted into the opening of the disk space on the left side  and impacted downward and anteriorly, and then using the kicker impactors,  into a transverse position as best as possible.  C-arm fluoro indicated the  cage in good position and alignment on the lateral views, subset beneath the  posterior aspect of the disk space at the L5-S1 level.  Bleeders again  controlled using bipolar electrocautery and Gelfoam, thrombin soaked, placed  into the  canal on the left side, lateral recess, controlling bleeding here.  Attention then turned to the L4-5 level, where the thecal sac at this level,  including the L5 nerve root, were retracted medially.  The L4 nerve root  within the neural foramen was able to be retracted superiorly.  The disk  space was entered on the left side just above the pedicle, medial to the  pedicle, using a 15-blade scalpel.  Pituitary rongeurs were used to debride  the disk of residual disk material present.  Not much was present.  A 3-mm  Kerrison was used to additionally debride annular material from the edges of  the diskotomy on the left side.  Continued disk resection was performed  using pituitary rongeurs, upbiting and straight.  Dilation was then carried  out at the disk space using a 7-mm dilator, then an 8-mm dilator.  The disk  space was then debrided of residual degenerative disk material using the pituitary rongeurs provided.  Then, the cartilaginous endplate material was  debrided using the straight and then upbiting right and upbiting left  curets, as well as ring curets down to bleeding bone endplate surfaces.  Once the disk space had been debrided of much disk material and endplate  material as possible, curettage indicated there was no further endplate  cartilage remaining.  Irrigation was performed.  A trials using 7 mm and 8  mm were carried out.  The 7, again, provided the best fit.  A 7-mm lordotic  cage was filled with local bone graft.  Additional local bone graft was  placed within the intervertebral disk space, the laminectomy and diskectomy  on the left side.  This was impacted into place with a trial 7-mm cage, and  then the permanent cage, a 7-mm lordotic cage, was then placed into the disk  space and impacted into place forward, and then carefully rotated using  kicker impactors into a transverse position and alignment.  With this then,  the nerve roots on the left side at L4 and L5 and  S1 were noted to be  exiting  without compression.  There was no residual bone graft on the left  side within the neural foramina or within the spinal canal noted.  Bleeders  were controlled using bipolar electrocautery and thrombin-soaked Gelfoam on  the left side at L4 and L5.  With this, then, on the left side, a 65-mm  length rod was then placed within the fasteners from L4 to the sacrum, and  they were carefully then attached to the fastener at the L4 level with a  very small amount of the rod above the fasteners as appropriate.  This was  then tightened with the fastener down to the rod to 80 foot pounds, with  application of the caps at each level.  Compression obtained between the  fasteners at L4 and L5, and the fastener at L5 was then tightened.  The cap  was tightened to the rod to 80 foot pounds.  And on the left side between  the L5 and the S1 level, then compression was obtained and the fastener cap  at the S1 level was then tightened to 80 foot pounds.  Similarly, this was  done on the right side, first attaching the rod to the fastener at the L4  level and obtaining correct torquing to 80 foot pounds.  Compression between  the L4 and L5 fasteners on the right side at L4 and L5 and tightening the  cap at the L5 level to 80 foot pounds.  Compression obtained, then, between  the L5 and S1 fasteners on the right side and the fastener at S1 was then  tightened to 80 foot pounds.  Intraoperative C-arm fluoro then observed to  demonstrate that the cage at the L4-5 level was quite well centralized.  That the L5-S1 level was felt to be primarily on the left side and posterior  in position such that we felt that we should rerotate this cage into a  better transverse position.  The fastener caps at the S1 level were then  carefully loosened, the caps maintaining their position.  With some slight  distraction across the lamina using a lamina spreader between the lamina of L5 and S1,  kicker impactors were then used to kick the cage into a more  transverse position and to obtain further depth at the L5-S1 level.  Once  this was completed, then the L5 nerve root was noted to be exiting without  difficulty.  It had previously been exiting as well, but care was taken to  insure that this was the case after having performed a relocating of the  lordotic cage at the L5-S1 level.  Once this was completed, then compression  was obtained between the fasteners on the left side at L5 and S1 and the cap  to the S1 level was then carefully tightened to 80 foot pounds.  Returning  to the right side, then, again compression obtained between the fasteners of  L5 and S1 and the cap to S1 was then tightened to 80 foot pounds.  Permanent  C-arm images were obtained in A/P and lateral planes, documenting the  pedicle screws and rods in excellent position and alignment and the cage at  the L4-5 level quite well centralized.  That at the L5-S1 level felt to be  more localized to the left side, however, in much better position and  alignment than seen previously.  Irrigation was then performed.  The  remaining bone graft was then placed over the posterolateral region on both  sides.  Careful inspection of the nerve roots on the left side at L4, L5 and  S1 demonstrated no abnormality, no signs of compression, no residual bone  material within the spinal canal.  Platelet-poor plasma was then sprayed  over the soft tissue margin s bilaterally.  The soft tissue was then allowed  to fall back into place.  A medium Hemovac drain was placed on the left side  along the left posterior aspect of the spine, exiting over the left lower  lumbar spine.  The lumbodorsal fascia was then reapproximated to the  interspinous ligaments and spinous processes, extending from L2 to S2.  This  was done using interrupted #1 Vicryl sutures in a simple and figure-of-eight  fashion.  Deep subcu layers were approximated  with interrupted 0 Vicryl  sutures, the more superficial layers with interrupted 2-0 Vicryl sutures,  the skin closed with a running subcuticular stitch of 4-0 Vicryl.  Tincture  of benzoin and Steri-Strips applied, 4 x 4's,  ABD pads, fixed to the skin with Hyperfix tape.  The patient was then  returned to a supine position, reactivated, extubated and returned to the  recovery room in satisfactory condition.  All instrument and sponge counts  were correct.      Kerrin Champagne, M.D.  Electronically Signed     JEN/MEDQ  D:  04/21/2006  T:  04/21/2006  Job:  [id number]
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
## 2010-12-12 ENCOUNTER — Encounter: Payer: Self-pay | Admitting: Internal Medicine

## 2010-12-13 ENCOUNTER — Other Ambulatory Visit: Payer: Self-pay | Admitting: Specialist

## 2010-12-13 DIAGNOSIS — M549 Dorsalgia, unspecified: Secondary | ICD-10-CM

## 2010-12-25 ENCOUNTER — Encounter: Payer: Self-pay | Admitting: Internal Medicine

## 2010-12-25 ENCOUNTER — Ambulatory Visit (INDEPENDENT_AMBULATORY_CARE_PROVIDER_SITE_OTHER): Payer: Medicare Other | Admitting: Internal Medicine

## 2010-12-25 VITALS — BP 118/72 | HR 78 | Ht 67.0 in | Wt 129.6 lb

## 2010-12-25 DIAGNOSIS — J309 Allergic rhinitis, unspecified: Secondary | ICD-10-CM

## 2010-12-25 DIAGNOSIS — J45909 Unspecified asthma, uncomplicated: Secondary | ICD-10-CM

## 2010-12-25 NOTE — Assessment & Plan Note (Signed)
Good control

## 2010-12-25 NOTE — Patient Instructions (Signed)
Continue present treatment
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
## 2010-12-25 NOTE — Assessment & Plan Note (Addendum)
Good now after seasonal flare in the spring,  Will continue vaccine

## 2011-01-01 ENCOUNTER — Ambulatory Visit
Admission: RE | Admit: 2011-01-01 | Discharge: 2011-01-01 | Disposition: A | Discharge status: Home / Self Care | Payer: Medicare Other | Source: Ambulatory Visit | Attending: Specialist | Admitting: Specialist

## 2011-01-01 DIAGNOSIS — M549 Dorsalgia, unspecified: Secondary | ICD-10-CM

## 2011-02-19 ENCOUNTER — Other Ambulatory Visit: Payer: Self-pay | Admitting: Internal Medicine

## 2011-02-21 ENCOUNTER — Telehealth: Payer: Self-pay | Admitting: Internal Medicine

## 2011-02-21 MED ORDER — FLUTICASONE PROPIONATE 50 MCG/ACT NA SUSP: 2.0000 | Freq: Two times a day (BID) | NASAL | Status: DC | PRN

## 2011-02-21 NOTE — Telephone Encounter (Signed)
rx for the flonase has been sent to the pharmacy for the pt.  Called and  Spoke with pts mother and she is aware that rx has been sent to the pharmacy

## 2011-03-21 ENCOUNTER — Other Ambulatory Visit: Payer: Self-pay | Admitting: Specialist

## 2011-03-21 DIAGNOSIS — Z981 Arthrodesis status: Secondary | ICD-10-CM

## 2011-03-21 DIAGNOSIS — M5430 Sciatica, unspecified side: Secondary | ICD-10-CM

## 2011-03-26 ENCOUNTER — Ambulatory Visit
Admission: RE | Admit: 2011-03-26 | Discharge: 2011-03-26 | Disposition: A | Discharge status: Home / Self Care | Payer: Medicare Other | Source: Ambulatory Visit | Attending: Specialist | Admitting: Specialist

## 2011-03-26 VITALS — BP 106/63 | HR 86

## 2011-03-26 DIAGNOSIS — Z981 Arthrodesis status: Secondary | ICD-10-CM

## 2011-03-26 DIAGNOSIS — M5430 Sciatica, unspecified side: Secondary | ICD-10-CM

## 2011-03-26 MED ORDER — IOHEXOL 180 MG/ML  SOLN
1.0000 mL | Freq: Once | INTRAMUSCULAR | Status: AC | PRN
  Administered 2011-03-26: 1 mL via EPIDURAL

## 2011-03-26 MED ORDER — METHYLPREDNISOLONE ACETATE 40 MG/ML INJ SUSP (RADIOLOG
120.0000 mg | Freq: Once | INTRAMUSCULAR | Status: AC
  Administered 2011-03-26: 120 mg via EPIDURAL

## 2011-05-07 ENCOUNTER — Other Ambulatory Visit: Payer: Self-pay | Admitting: Internal Medicine

## 2011-06-05 ENCOUNTER — Other Ambulatory Visit: Payer: Self-pay | Admitting: Specialist

## 2011-06-05 DIAGNOSIS — M545 Low back pain: Secondary | ICD-10-CM

## 2011-06-25 ENCOUNTER — Ambulatory Visit
Admission: RE | Admit: 2011-06-25 | Discharge: 2011-06-25 | Disposition: A | Discharge status: Home / Self Care | Payer: Medicare Other | Source: Ambulatory Visit | Attending: Specialist | Admitting: Specialist

## 2011-06-25 ENCOUNTER — Ambulatory Visit: Payer: Medicare Other | Admitting: Internal Medicine

## 2011-06-25 DIAGNOSIS — M545 Low back pain: Secondary | ICD-10-CM

## 2011-06-25 MED ORDER — METHYLPREDNISOLONE ACETATE 40 MG/ML INJ SUSP (RADIOLOG
120.0000 mg | Freq: Once | INTRAMUSCULAR | Status: AC
  Administered 2011-06-25: 120 mg via EPIDURAL

## 2011-06-25 MED ORDER — IOHEXOL 180 MG/ML  SOLN
1.0000 mL | Freq: Once | INTRAMUSCULAR | Status: AC | PRN
  Administered 2011-06-25: 1 mL via EPIDURAL

## 2011-06-27 ENCOUNTER — Ambulatory Visit: Payer: Medicare Other | Admitting: Physical Medicine & Rehabilitation

## 2011-07-04 ENCOUNTER — Encounter: Payer: Medicare Other | Attending: Physical Medicine & Rehabilitation

## 2011-07-04 ENCOUNTER — Ambulatory Visit: Payer: Medicare Other | Admitting: Physical Medicine & Rehabilitation

## 2011-07-04 DIAGNOSIS — R209 Unspecified disturbances of skin sensation: Secondary | ICD-10-CM | POA: Insufficient documentation

## 2011-07-04 DIAGNOSIS — M62838 Other muscle spasm: Secondary | ICD-10-CM | POA: Insufficient documentation

## 2011-07-04 DIAGNOSIS — M25519 Pain in unspecified shoulder: Secondary | ICD-10-CM | POA: Insufficient documentation

## 2011-07-04 DIAGNOSIS — M545 Low back pain, unspecified: Secondary | ICD-10-CM | POA: Insufficient documentation

## 2011-07-04 DIAGNOSIS — M542 Cervicalgia: Secondary | ICD-10-CM | POA: Insufficient documentation

## 2011-07-04 DIAGNOSIS — M961 Postlaminectomy syndrome, not elsewhere classified: Secondary | ICD-10-CM | POA: Insufficient documentation

## 2011-07-04 DIAGNOSIS — G894 Chronic pain syndrome: Secondary | ICD-10-CM | POA: Insufficient documentation

## 2011-07-04 DIAGNOSIS — Z981 Arthrodesis status: Secondary | ICD-10-CM | POA: Insufficient documentation

## 2011-07-04 DIAGNOSIS — R259 Unspecified abnormal involuntary movements: Secondary | ICD-10-CM | POA: Insufficient documentation

## 2011-07-04 DIAGNOSIS — M549 Dorsalgia, unspecified: Secondary | ICD-10-CM | POA: Insufficient documentation

## 2011-07-04 DIAGNOSIS — IMO0001 Reserved for inherently not codable concepts without codable children: Secondary | ICD-10-CM

## 2011-07-04 NOTE — Procedures (Signed)
Natalie Berg, Natalie Berg               ACCOUNT NO.:  [id number]  MEDICAL RECORD NO.:  [id number]           PATIENT TYPE:  O  LOCATION:  [facility]                          FACILITY:  [facility]  PHYSICIAN:  Erick Colace, M.D.DATE OF BIRTH:  1971/03/08  DATE OF PROCEDURE: DATE OF DISCHARGE:                              OPERATIVE REPORT  PROCEDURE:  Bilateral upper trapezius trigger point injections.  INDICATION:  Upper back pain, history of cervical fusion, has taut bands at the upper trapezius.  Her pain is only partially responsive to medication management including narcotic analgesics and interferes with activities.  The patient was placed in a seated position.  Area was marked and prepped with Betadine and alcohol, entered with 25-gauge inch and half needle, 1 mL of 1% lidocaine injected into each site.  The patient tolerated the procedure well.  Postprocedure instructions given.     Erick Colace, M.D. Electronically Signed    AEK/MEDQ  D:  07/04/2011 16:39:02  T:  07/04/2011 86:57:84  Job:  [id number]

## 2011-07-04 NOTE — Progress Notes (Signed)
REASON FOR VISIT:  Chronic neck and chronic low back pain.  HISTORY:  A 40 year old female who states she has a approximately 20- year history of neck pain and back pain.  She has been seen and followed primarily by Dr. Vira Browns from [facility], but also has undergone evaluation by Dr. Avie Echevaria from Neurology, Dr. Kellie Simmering from Rheumatology, she has seen her primary physician, Dr. Tiburcio Pea and has had injections from Dr. Karin Golden.  In addition, she has had a psychiatric consultation from Dr. Jennelle Human.  She has had multiple surgical procedures including C4-5 and C5-6 ACDF on Dec 17, 2004.  On April 21, 2006, L4-5 and L5-S1 fusion, Dr. Otelia Sergeant.  In 2008, she underwent right shoulder rotator cuff  debridement and subacromial decompression.  Her primary complaint today is neck pain and radiating into the upper back and shoulder area.  Her pain is described as sharp, burning, dull, stabbing, tingling, aching and constant.  She rates her pain as 8/10. Pain interferes with activity at a moderate level.  Sleep is fair.  Her pain is worse in the morning and evening hours and is exacerbated by bending, sitting, standing and working as a Scientist, physiological; improves with rest, heat, therapy, pacing activities, medication, TENS as well as injections.  She has tried numerous modalities in the last 20 years per her report including physical therapy, last performed about a year ago. She has had very good relief from epidural steroid injection and has undergone injections about every 3 months.  Her last injection is reported as March 26, 2011.  She gets 120 mg of Depo-Medrol each injection.  She has trialed neck injections, but these were not particularly helpful for her.  PAST MEDICAL HISTORY:  Significant for allergies, sees Dr. Jetty Duhamel for that.  PSYCHIATRIC HISTORY:  Significant for psychiatric hospitalization for suicidal and homicidal ideation in 2002.  She states she was  undergoing some marital stress at that time.  She subsequently was divorced.  She has no history of alcohol or drug abuse.  FAMILY HISTORY:  Alcohol abuse.  She does have a history of obsessive compulsive disorder as well as depression.  Her opioid risk tool score is 5 putting her in the moderate range.  REVIEW OF SYSTEMS:  Positive for bladder control problems, bowel control problems, weakness, numbness, tremor, tingling, trouble walking, spasms, dizziness, depression, anxiety, weight loss and night sweats.  She states she has had these symptoms for awhile.  She has multiple physicians for different issues.  Family history heart disease, lung disease, diabetes, hypertension and psychiatric problems.  SOCIAL HISTORY:  Divorced, lives with her mother.  PHYSICAL EXAMINATION:  VITAL SIGNS:  Her blood pressure is 132/61, pulse 83, respirations 16 and O2 sat 99% on room air.  Height 5 feet, 8 inches.  Weight 123 pounds. GENERAL:  Well-developed, well-nourished thin female, in no acute distress.  Mood and affect are appropriate. EXTREMITIES:  Her neck has limited range trending towards the left side about 75% range to the right, about 75% range forward flexion, extension and are at the 50% range. She has tenderness to palpation bilateral upper trapezius muscles.  She has negative impingement sign bilateral shoulders.  She has good strength in the deltoid, biceps, triceps, grip.  She has reduced deep tendon reflex in the left upper extremity and hyperactive on the right side at the biceps, triceps and brachioradialis.  Her lower extremity reflexes are mildly hyperreflexic at 3+ bilateral knees and ankles.  No evidence of clonus  at the ankles.  Her sensation is intact bilateral upper and lower extremities.  Her lower extremity strength is normal with exception of left EHL.  Strength is reduced. Her back has no tenderness to palpation.  No tenderness over the greater trochanters of the hip.   Lumbar spine range of motion is 75% range forward flexion, extension and 50% lateral rotation and bending.  IMPRESSION: 1. Lumbar post laminectomy syndrome.  She does well with L3-4 epidural     injections every 3 months or so.  She has been doing this with Dr.     Karin Golden for many years and would like to continue doing so.  She has     my opinion in terms of doing this on a more frequent basis, but I     had concurred when it sent by Dr. Otelia Sergeant. 2. Neck pain.  Once again, cervical post laminectomy syndrome.     However, she may have some myofascial pain superimposed.  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  We will trial her on trigger point     injections today. 3. Chronic pain syndrome.  She really like to avoid narcotic     analgesics and certainly given her history of psychiatric problems     and opioid risk tool score 5, she would present as a moderate risk     for narcotic analgesics.  She requests only once a day 1 tablet per     day and certainly this can be monitored quite easily.  I would     recommend she continues seeing her psychiatrist on a regular basis. 4. We discussed other treatment options including acupuncture.  She     would like to try this, but would first need to see what her new     insurance benefits would be. 5. I will see her back in 2-3 weeks.     Erick Colace, M.D. Electronically Signed    AEK/MedQ D:  07/04/2011 16:37:55  T:  07/04/2011 22:35:20  Job #:  [id number]  cc:   Kerrin Champagne, M.D. Fax: [phone]  Genene Churn. Love, M.D. Fax: [phone]  Jetty Duhamel., M.D. Fax: [phone]

## 2011-07-17 ENCOUNTER — Ambulatory Visit: Payer: Medicare Other | Admitting: Physical Medicine & Rehabilitation

## 2011-07-17 DIAGNOSIS — M961 Postlaminectomy syndrome, not elsewhere classified: Secondary | ICD-10-CM

## 2011-07-17 NOTE — Assessment & Plan Note (Signed)
A 40 year old female with approximately 20-year history of neck pain and back pain.  She has seen Dr. Otelia Sergeant primarily, but also had been followed by Dr. Avie Echevaria.  He has undergone evaluation by Dr. Avie Echevaria from Neurology, Dr. Kellie Simmering from Rheumatology, primary physician Dr. Tiburcio Pea and received injections from Dr. Karin Golden, psychiatric care from Dr. Jennelle Human.  She has undergone multiple surgical procedures including C4-5 and C5-6 ACDF Dec 17, 2004 and L4-5, L5-S1 fusion in 2007, right rotator cuff debridement.  I saw her on initial consultation July 04, 2011.  We did a bilateral trigger point injection.  This is not helpful for her.  Pain level is around 8/10.  Her pain diagram basically shows her whole body darkened and except for her head and chest and pelvic region.  Her walking tolerance is 20-30 minutes.  She can climb steps.  She drives.  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  She needs help with meal prep, household duties, shopping.  REVIEW OF SYSTEMS:  Positive for bladder problems, bowel problems, weakness, numbness, tremor, tingling, trouble walking, spasms, dizziness, depression, anxiety and respiratory infections wheezing.  SOCIAL HISTORY:  Divorced, lives with her mother.  FAMILY HISTORY:  Heart disease, lung disease, diabetes, hypertension and psychiatric problems.  PHYSICAL EXAMINATION:  VITAL SIGNS:  Blood pressure 132/61, pulse 83, respirations 16 and O2 sat 99% on room air.  Height 5 feet 8 inches. Weight 123 pounds. BACK:  She has mild tenderness to palpation in the upper traps.  Her cervical and spine range of motion about 75% range forward flexion, extension, lateral rotation and bending. EXTREMITIES:  Upper extremity strength is normal.  Lower extremity strength is normal.  She has no tenderness to palpation in the lumbar spine at the current time.  Hip, knee and ankle range of motion are all normal.  IMPRESSION: 1. Cervical post  laminectomy syndrome. 2. Lumbar post laminectomy syndrome.  RECOMMENDATION: 1. I think she can benefit from acupuncture.  We will [facility]     [facility]  She does have some insurance     limitations. 2. I do not think further trigger point injections are helpful. 3. While she has a history of psychiatric hospitalization 10 years ago     for suicidal and homicidal ideation, she reports she was undergoing     marital stress and no longer is married to that same individual.     She has no history of drug or alcohol abuse.  Her opioid risk tool     score is 5 putting her in the moderate range.  She feels like she     only needs about twice a day low-dose hydrocodone to help maintain     function.  We will go ahead and get her started on this if we get a     clean urine drug screen.  She really should not have any narcotic     analgesics in her     urine right now given her history of not taking them for more than     3 days. 4. Discussed with the patient, agrees with plan, [facility] in 1     month.     Erick Colace, M.D. Electronically Signed    AEK/MedQ D:  07/17/2011 14:27:28  T:  07/17/2011 21:20:34  Job #:  [id number]  cc:   Kerrin Champagne, M.D. Fax: [phone]  Jetty Duhamel., M.D. Fax: [phone]

## 2011-07-23 DIAGNOSIS — M5020 Other cervical disc displacement, unspecified cervical region: Secondary | ICD-10-CM | POA: Diagnosis not present

## 2011-07-23 DIAGNOSIS — M5137 Other intervertebral disc degeneration, lumbosacral region: Secondary | ICD-10-CM | POA: Diagnosis not present

## 2011-07-23 DIAGNOSIS — M961 Postlaminectomy syndrome, not elsewhere classified: Secondary | ICD-10-CM | POA: Diagnosis not present

## 2011-08-11 ENCOUNTER — Encounter: Payer: Medicare Other | Attending: Neurosurgery | Admitting: Neurosurgery

## 2011-08-11 DIAGNOSIS — M79609 Pain in unspecified limb: Secondary | ICD-10-CM | POA: Diagnosis not present

## 2011-08-11 DIAGNOSIS — M961 Postlaminectomy syndrome, not elsewhere classified: Secondary | ICD-10-CM | POA: Insufficient documentation

## 2011-08-11 DIAGNOSIS — G894 Chronic pain syndrome: Secondary | ICD-10-CM | POA: Diagnosis not present

## 2011-08-11 DIAGNOSIS — M542 Cervicalgia: Secondary | ICD-10-CM | POA: Insufficient documentation

## 2011-08-11 DIAGNOSIS — M545 Low back pain, unspecified: Secondary | ICD-10-CM | POA: Insufficient documentation

## 2011-08-11 DIAGNOSIS — G8929 Other chronic pain: Secondary | ICD-10-CM | POA: Insufficient documentation

## 2011-08-12 ENCOUNTER — Other Ambulatory Visit: Payer: Self-pay | Admitting: Internal Medicine

## 2011-08-12 NOTE — Assessment & Plan Note (Signed)
This is a patient of Dr. Wynn Banker, that is fairly new to the practice. She just had her UDS completed which was consistent with medications she stated she was taking, seen here today for upper extremity, left arm pain, as well as some low back and left leg pain.  She states she needs anterior cervical diskectomy and fusion, Dr. Barbaraann Faster plan and she is helping to get that done in the next couple months.  Her average pain is 5-7, it is sharp, burning, dull, stabbing, tingling, aching pain. General activity level is 7.  Pain is same 24 hours a day.  Sleep patterns are fair.  All activities aggravate; rest, heat, medication, TENs unit, and injections help, although she states the last injection she got with Dr. Otelia Sergeant, did not help.  She walks without assistance. She can walk 20 minutes at a time.  She climb steps, drive.  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  REVIEW OF SYSTEMS:  Notable for difficulties as described above as well as some weakness, paresthesias, trouble walking, spasms, dizziness, anxiety.  No suicidal thoughts or aberrant behaviors.  Past medical history, social history, family history are unchanged.  PHYSICAL EXAMINATION:  VITAL SIGNS:  Blood pressure is 125/71, pulse 62, respirations 14, O2 sats 97 on room air. MUSCULOSKELETAL:  Motor strength sensation are intact in the upper and lower extremities. CONSTITUTIONALLY:  She is within normal limits.  She is alert and oriented x3.  ASSESSMENT: 1. Lumbar post laminectomy syndrome. 2. Cervicalgia. 3. Chronic pain.  PLAN:  Dr. Otelia Sergeant has just given her some hydrocodone, I am going to give her a prescription to use after she runs out of that and she may not be out of it before she comes back here.  Hydrocodone 5/500 one-half to one p.o. q.12 hours 60 with no refill.  She is also taking prednisone.  She states that is the only anti-inflammatory that helps.  She has tried every anti-inflammatory that is made, so I  gave her prednisone 10 mg, one-half to one tab every day, 30 with 1 refill.  Her questions were encouraged and answered.  She will follow up with Dr. Wynn Banker in a month.     Jalani Cullifer L. Blima Dessert Electronically Signed    RLW/MedQ D:  08/11/2011 15:19:33  T:  08/12/2011 02:12:18  Job #:  [id number]

## 2011-08-13 ENCOUNTER — Telehealth: Payer: Self-pay | Admitting: Allergy

## 2011-08-13 ENCOUNTER — Ambulatory Visit: Payer: Medicare Other | Admitting: Neurosurgery

## 2011-08-13 NOTE — Telephone Encounter (Signed)
[facility]  Requesting rx  advair [id number]  Use 1 puff bid Last fill 07/03/2011 No Known Allergies Dr  Maple Hudson is this ok to fill? Thank you

## 2011-08-14 MED ORDER — FLUTICASONE-SALMETEROL 250-50 MCG/DOSE IN AEPB: INHALATION_SPRAY | RESPIRATORY_TRACT | Status: DC

## 2011-08-14 NOTE — Telephone Encounter (Signed)
Ok to refill Advair as requested

## 2011-08-14 NOTE — Telephone Encounter (Signed)
Rx has been sent  

## 2011-08-20 DIAGNOSIS — M961 Postlaminectomy syndrome, not elsewhere classified: Secondary | ICD-10-CM | POA: Diagnosis not present

## 2011-08-20 DIAGNOSIS — M5020 Other cervical disc displacement, unspecified cervical region: Secondary | ICD-10-CM | POA: Diagnosis not present

## 2011-08-20 DIAGNOSIS — M5137 Other intervertebral disc degeneration, lumbosacral region: Secondary | ICD-10-CM | POA: Diagnosis not present

## 2011-08-20 DIAGNOSIS — M47812 Spondylosis without myelopathy or radiculopathy, cervical region: Secondary | ICD-10-CM | POA: Diagnosis not present

## 2011-09-15 ENCOUNTER — Encounter: Payer: Self-pay | Admitting: Physical Medicine & Rehabilitation

## 2011-09-15 ENCOUNTER — Encounter: Payer: Medicare Other | Attending: Physical Medicine & Rehabilitation

## 2011-09-15 ENCOUNTER — Ambulatory Visit (HOSPITAL_BASED_OUTPATIENT_CLINIC_OR_DEPARTMENT_OTHER): Payer: 59 | Admitting: Physical Medicine & Rehabilitation

## 2011-09-15 DIAGNOSIS — M961 Postlaminectomy syndrome, not elsewhere classified: Secondary | ICD-10-CM | POA: Insufficient documentation

## 2011-09-15 DIAGNOSIS — M542 Cervicalgia: Secondary | ICD-10-CM | POA: Diagnosis not present

## 2011-09-15 DIAGNOSIS — M545 Low back pain, unspecified: Secondary | ICD-10-CM | POA: Diagnosis not present

## 2011-09-15 DIAGNOSIS — R259 Unspecified abnormal involuntary movements: Secondary | ICD-10-CM | POA: Insufficient documentation

## 2011-09-15 DIAGNOSIS — R209 Unspecified disturbances of skin sensation: Secondary | ICD-10-CM | POA: Insufficient documentation

## 2011-09-15 DIAGNOSIS — Z981 Arthrodesis status: Secondary | ICD-10-CM | POA: Insufficient documentation

## 2011-09-15 DIAGNOSIS — G894 Chronic pain syndrome: Secondary | ICD-10-CM | POA: Insufficient documentation

## 2011-09-15 DIAGNOSIS — M25519 Pain in unspecified shoulder: Secondary | ICD-10-CM | POA: Diagnosis not present

## 2011-09-15 DIAGNOSIS — M62838 Other muscle spasm: Secondary | ICD-10-CM | POA: Diagnosis not present

## 2011-09-15 DIAGNOSIS — M549 Dorsalgia, unspecified: Secondary | ICD-10-CM | POA: Diagnosis not present

## 2011-09-15 NOTE — Patient Instructions (Signed)
Stop prednisone you get epidural injection

## 2011-09-15 NOTE — Progress Notes (Signed)
  Subjective:    Patient ID: Natalie Berg, female    DOB: 22-Feb-1971, 41 y.o.   MRN: [id number] Deciding on repeat cervical surgery.  Taking 1/2 tab hydrocodone once or twice a day HPIPain Inventory Average Pain 5 Pain Right Now 5 My pain is constant, sharp, burning, dull, stabbing, tingling and aching  In the last 24 hours, has pain interfered with the following? General activity 5 Relation with others 5 What TIME of day is your pain at its worst? morning evening and night Sleep (in general) Fair  Pain is worse with: bending, sitting, inactivity and some activites Pain improves with: rest, heat/ice, pacing activities, medication, TENS and injections Relief from Meds: 5  Mobility walk without assistance walk with assistance how many minutes can you walk? 20 ability to climb steps?  yes do you drive?  yes Do you have any goals in this area?  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  yes  Neuro/Psych bladder control problems weakness numbness tremor tingling trouble walking spasms dizziness anxiety  Prior Studies Any changes since last visit?  no  Physicians involved in your care Any changes since last visit?  no  Review of Systems  Constitutional: Positive for diaphoresis.  HENT: Negative.   Eyes: Negative.   Respiratory: Positive for shortness of breath.   Cardiovascular: Negative.   Gastrointestinal: Negative.   Genitourinary: Negative.   Musculoskeletal: Negative.   Skin: Negative.   Neurological: Negative.   Hematological: Negative.   Psychiatric/Behavioral: Negative.        Objective:   Physical Exam  Constitutional: She appears well-developed and well-nourished.  HENT:  Head: Normocephalic and atraumatic.  Neck: Normal range of motion.  Musculoskeletal:       Right shoulder: Normal.       Left  shoulder: Normal.       Left hip: She exhibits decreased range of motion.       Lumbar back: She exhibits decreased range of motion.  Neurological: She has normal strength. Gait normal.  Reflex Scores:      Patellar reflexes are 3+ on the right side and 3+ on the left side.      Achilles reflexes are 3+ on the right side and 3+ on the left side.      Hypersensitive L 4th and 5th digits       Assessment & Plan:  1. Cervical postlaminectomy syndrome  2. Lumbar post laminectomy syndrome  3. Chronic pain syndrome continue current medications. Followup in 3 months.  4.  Pt taking prednisone 5-10 mg prn pain in low back and LLE is due for ESI, prefers to see Dr. Karin Golden for this.

## 2011-09-29 DIAGNOSIS — M5137 Other intervertebral disc degeneration, lumbosacral region: Secondary | ICD-10-CM | POA: Diagnosis not present

## 2011-09-29 DIAGNOSIS — M47812 Spondylosis without myelopathy or radiculopathy, cervical region: Secondary | ICD-10-CM | POA: Diagnosis not present

## 2011-09-29 DIAGNOSIS — M5020 Other cervical disc displacement, unspecified cervical region: Secondary | ICD-10-CM | POA: Diagnosis not present

## 2011-10-01 ENCOUNTER — Other Ambulatory Visit: Payer: Self-pay | Admitting: Orthopedic Surgery

## 2011-10-01 DIAGNOSIS — R200 Anesthesia of skin: Secondary | ICD-10-CM

## 2011-10-01 DIAGNOSIS — M4802 Spinal stenosis, cervical region: Secondary | ICD-10-CM

## 2011-10-01 DIAGNOSIS — M79602 Pain in left arm: Secondary | ICD-10-CM

## 2011-10-08 ENCOUNTER — Ambulatory Visit
Admission: RE | Admit: 2011-10-08 | Discharge: 2011-10-08 | Disposition: A | Discharge status: Home / Self Care | Payer: 59 | Source: Ambulatory Visit | Attending: Orthopedic Surgery | Admitting: Orthopedic Surgery

## 2011-10-08 DIAGNOSIS — M4802 Spinal stenosis, cervical region: Secondary | ICD-10-CM

## 2011-10-08 DIAGNOSIS — R5381 Other malaise: Secondary | ICD-10-CM | POA: Diagnosis not present

## 2011-10-08 DIAGNOSIS — R209 Unspecified disturbances of skin sensation: Secondary | ICD-10-CM | POA: Diagnosis not present

## 2011-10-08 DIAGNOSIS — R200 Anesthesia of skin: Secondary | ICD-10-CM

## 2011-10-08 DIAGNOSIS — M79602 Pain in left arm: Secondary | ICD-10-CM

## 2011-10-08 DIAGNOSIS — M5020 Other cervical disc displacement, unspecified cervical region: Secondary | ICD-10-CM | POA: Diagnosis not present

## 2011-10-09 ENCOUNTER — Other Ambulatory Visit: Payer: Self-pay | Admitting: Physical Medicine & Rehabilitation

## 2011-10-09 DIAGNOSIS — M549 Dorsalgia, unspecified: Secondary | ICD-10-CM

## 2011-11-03 ENCOUNTER — Ambulatory Visit
Admission: RE | Admit: 2011-11-03 | Discharge: 2011-11-03 | Disposition: A | Discharge status: Home / Self Care | Payer: 59 | Source: Ambulatory Visit | Attending: Physical Medicine & Rehabilitation | Admitting: Physical Medicine & Rehabilitation

## 2011-11-03 DIAGNOSIS — M549 Dorsalgia, unspecified: Secondary | ICD-10-CM

## 2011-11-03 MED ORDER — METHYLPREDNISOLONE ACETATE 40 MG/ML INJ SUSP (RADIOLOG
120.0000 mg | Freq: Once | INTRAMUSCULAR | Status: AC
  Administered 2011-11-03: 120 mg via EPIDURAL

## 2011-11-03 MED ORDER — IOHEXOL 180 MG/ML  SOLN
1.0000 mL | Freq: Once | INTRAMUSCULAR | Status: AC | PRN
  Administered 2011-11-03: 1 mL via EPIDURAL

## 2011-11-24 DIAGNOSIS — M47812 Spondylosis without myelopathy or radiculopathy, cervical region: Secondary | ICD-10-CM | POA: Diagnosis not present

## 2011-12-01 ENCOUNTER — Encounter: Payer: Self-pay | Admitting: Physical Medicine & Rehabilitation

## 2011-12-16 ENCOUNTER — Telehealth: Payer: Self-pay | Admitting: Internal Medicine

## 2011-12-16 ENCOUNTER — Ambulatory Visit: Payer: 59 | Admitting: Physical Medicine & Rehabilitation

## 2011-12-16 MED ORDER — FLUTICASONE-SALMETEROL 250-50 MCG/DOSE IN AEPB: INHALATION_SPRAY | RESPIRATORY_TRACT | Status: DC

## 2011-12-16 NOTE — Telephone Encounter (Signed)
Pt has appt on 01-19-12. Refill sent to last until then. Carron Curie, CMA

## 2011-12-29 ENCOUNTER — Ambulatory Visit: Payer: 59 | Admitting: Physical Medicine & Rehabilitation

## 2011-12-29 ENCOUNTER — Other Ambulatory Visit: Payer: Self-pay | Admitting: *Deleted

## 2011-12-29 MED ORDER — HYDROCODONE-ACETAMINOPHEN 7.5-500 MG PO TABS: 0.5000 | ORAL_TABLET | Freq: Two times a day (BID) | ORAL | Status: DC

## 2011-12-31 ENCOUNTER — Telehealth: Payer: Self-pay | Admitting: *Deleted

## 2011-12-31 NOTE — Telephone Encounter (Signed)
Calling to find out about vicodin refill. States she had requested it last week.  I notified Natalie Berg's mother that we called it in on Monday 12/29/11.

## 2012-01-05 ENCOUNTER — Encounter: Payer: Self-pay | Admitting: Physical Medicine & Rehabilitation

## 2012-01-05 ENCOUNTER — Ambulatory Visit (HOSPITAL_BASED_OUTPATIENT_CLINIC_OR_DEPARTMENT_OTHER): Payer: 59 | Admitting: Physical Medicine & Rehabilitation

## 2012-01-05 ENCOUNTER — Encounter: Payer: 59 | Attending: Physical Medicine & Rehabilitation

## 2012-01-05 ENCOUNTER — Ambulatory Visit: Payer: 59 | Admitting: Physical Medicine & Rehabilitation

## 2012-01-05 VITALS — BP 120/75 | HR 75 | Resp 14 | Ht 68.0 in | Wt 127.0 lb

## 2012-01-05 DIAGNOSIS — M961 Postlaminectomy syndrome, not elsewhere classified: Secondary | ICD-10-CM | POA: Diagnosis not present

## 2012-01-05 DIAGNOSIS — G8929 Other chronic pain: Secondary | ICD-10-CM | POA: Insufficient documentation

## 2012-01-05 DIAGNOSIS — M4802 Spinal stenosis, cervical region: Secondary | ICD-10-CM | POA: Insufficient documentation

## 2012-01-05 DIAGNOSIS — IMO0002 Reserved for concepts with insufficient information to code with codable children: Secondary | ICD-10-CM | POA: Insufficient documentation
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
## 2012-01-05 NOTE — Patient Instructions (Signed)
The current prescription that has been sent to your pharmacy is for hydrocodone 7.5 mg. You can take this twice a day. There will be 60 tablets. This should last you one month. You'll see my assistant Clydie Braun next month. We have a urine drug screen test today, we will need to see a prescription or the bottle for oxycontin if it shows up in your urine.

## 2012-01-09 ENCOUNTER — Telehealth: Payer: Self-pay | Admitting: *Deleted

## 2012-01-09 NOTE — Telephone Encounter (Signed)
Message copied by Caryl Ada on Fri Jan 09, 2012  3:44 PM ------      Message from: Erick Colace      Created: Fri Jan 09, 2012  3:42 PM       Discharge      We are prescribing hydrocodone      She indicates that she took only one Oxycodone from an old prescription (which also is not allowed per policy) but UDS indicates a much higher level of oxycodone

## 2012-01-09 NOTE — Telephone Encounter (Signed)
LM with pt mother to have her call us.

## 2012-01-12 NOTE — Telephone Encounter (Signed)
Pt called back about message that was left for her. A message was left on the nurse line. I tried calling her back at the number that was given but her voicemail hasn't been set up. Will try later.

## 2012-01-19 ENCOUNTER — Ambulatory Visit (INDEPENDENT_AMBULATORY_CARE_PROVIDER_SITE_OTHER): Payer: 59 | Admitting: Internal Medicine

## 2012-01-19 ENCOUNTER — Encounter: Payer: Self-pay | Admitting: Internal Medicine

## 2012-01-19 VITALS — BP 100/62 | HR 93 | Ht 68.0 in | Wt 123.2 lb

## 2012-01-19 DIAGNOSIS — J45998 Other asthma: Secondary | ICD-10-CM

## 2012-01-19 DIAGNOSIS — J309 Allergic rhinitis, unspecified: Secondary | ICD-10-CM

## 2012-01-19 DIAGNOSIS — J45909 Unspecified asthma, uncomplicated: Secondary | ICD-10-CM

## 2012-01-19 MED ORDER — EPINEPHRINE 0.3 MG/0.3ML IJ DEVI: INTRAMUSCULAR | Status: DC

## 2012-01-19 MED ORDER — FLUTICASONE-SALMETEROL 250-50 MCG/DOSE IN AEPB: INHALATION_SPRAY | RESPIRATORY_TRACT | Status: DC

## 2012-01-19 MED ORDER — ALBUTEROL SULFATE HFA 108 (90 BASE) MCG/ACT IN AERS: INHALATION_SPRAY | RESPIRATORY_TRACT | Status: DC

## 2012-01-19 MED ORDER — FLUTICASONE PROPIONATE 50 MCG/ACT NA SUSP: NASAL | Status: DC
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
## 2012-01-19 NOTE — Patient Instructions (Addendum)
Ok to continue allergy vaccine once month when that is enough. Anticipate problem seasons and move vaccine up to ever 2 weeks or every week in advance of those times.  Scripts sent for your refills.  Sample Advair 250/ 50  And sample Advair 500/50     1 puff, then rinse, twice daily  Try chlortimeton as an otc antihistamine if it is cheaper than claritin without being too sedating.   Please call as needed

## 2012-01-22 NOTE — Assessment & Plan Note (Signed)
She can continue her injections monthly during quiet seasons, with increased back to weekly for her problem seasons. See how that does for her. She is convinced that she is better off on vaccine but finances are a problem. We discussed Chlor-Trimeton and other OTC antihistamines.

## 2012-01-22 NOTE — Assessment & Plan Note (Signed)
She is getting low dose frequent steroids. I can't tell what her asthma control would be like without that. She is very aware of the side effect issues.  Plan-refill Advair giving both the 250 and 500 strengths as samples, the 250 as prescription. Refilled Ventolin rescue inhaler.

## 2012-02-05 ENCOUNTER — Encounter: Payer: Self-pay | Admitting: Physical Medicine and Rehabilitation

## 2012-02-05 ENCOUNTER — Encounter: Payer: 59 | Attending: Physical Medicine & Rehabilitation | Admitting: Physical Medicine and Rehabilitation

## 2012-02-05 VITALS — BP 108/67 | HR 87 | Resp 16 | Ht 68.0 in | Wt 122.0 lb

## 2012-02-05 DIAGNOSIS — M4802 Spinal stenosis, cervical region: Secondary | ICD-10-CM | POA: Insufficient documentation

## 2012-02-05 DIAGNOSIS — G8929 Other chronic pain: Secondary | ICD-10-CM | POA: Insufficient documentation

## 2012-02-05 DIAGNOSIS — M961 Postlaminectomy syndrome, not elsewhere classified: Secondary | ICD-10-CM | POA: Insufficient documentation

## 2012-02-05 DIAGNOSIS — IMO0002 Reserved for concepts with insufficient information to code with codable children: Secondary | ICD-10-CM | POA: Diagnosis not present

## 2012-02-05 MED ORDER — HYDROCODONE-ACETAMINOPHEN 7.5-500 MG PO TABS: 0.5000 | ORAL_TABLET | Freq: Two times a day (BID) | ORAL | Status: DC
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
## 2012-02-06 ENCOUNTER — Telehealth: Payer: Self-pay | Admitting: *Deleted

## 2012-02-06 ENCOUNTER — Encounter: Payer: Self-pay | Admitting: Physical Medicine & Rehabilitation

## 2012-02-06 NOTE — Telephone Encounter (Signed)
Pt wants UDS results mailed to her. Those will be put in the mail today. Pt mother aware.

## 2012-02-10 ENCOUNTER — Telehealth: Payer: Self-pay | Admitting: *Deleted

## 2012-02-10 NOTE — Telephone Encounter (Signed)
Spoke to pt mother about this and she was just as upset as the pt. She told me that we should be paying this since we took it upon ourselves to go out of network. I advised her to tell the pt to call [employer] to discuss the bill. She continued to tell me that we should be responsible for this. I asked her if she would have the pt call us and ask to speak to our office manager about this.

## 2012-02-10 NOTE — Telephone Encounter (Signed)
Pt called to let us know that we sent her urine specimen to an out of network provider and she has received a bill for $450. She is very upset and wants [place] to cover this cost. Please call.

## 2012-02-12 NOTE — Telephone Encounter (Signed)
Per our office manager, she spoke to pt about this matter.

## 2012-02-16 DIAGNOSIS — M47812 Spondylosis without myelopathy or radiculopathy, cervical region: Secondary | ICD-10-CM | POA: Diagnosis not present

## 2012-02-16 DIAGNOSIS — M5020 Other cervical disc displacement, unspecified cervical region: Secondary | ICD-10-CM | POA: Diagnosis not present

## 2012-03-24 ENCOUNTER — Other Ambulatory Visit: Payer: Self-pay | Admitting: Physician Assistant

## 2012-03-24 DIAGNOSIS — M549 Dorsalgia, unspecified: Secondary | ICD-10-CM

## 2012-04-30 DIAGNOSIS — M5020 Other cervical disc displacement, unspecified cervical region: Secondary | ICD-10-CM | POA: Diagnosis present

## 2012-05-03 ENCOUNTER — Encounter (HOSPITAL_COMMUNITY)
Admission: RE | Admit: 2012-05-03 | Discharge: 2012-05-03 | Disposition: A | Discharge status: Home / Self Care | Payer: 59 | Source: Ambulatory Visit | Attending: Specialist | Admitting: Specialist

## 2012-05-03 ENCOUNTER — Encounter (HOSPITAL_COMMUNITY): Payer: Self-pay

## 2012-05-03 ENCOUNTER — Ambulatory Visit (HOSPITAL_COMMUNITY)
Admission: RE | Admit: 2012-05-03 | Discharge: 2012-05-03 | Disposition: A | Discharge status: Home / Self Care | Payer: 59 | Source: Ambulatory Visit | Attending: Specialist | Admitting: Specialist

## 2012-05-03 DIAGNOSIS — Z01818 Encounter for other preprocedural examination: Secondary | ICD-10-CM | POA: Insufficient documentation

## 2012-05-03 DIAGNOSIS — J45909 Unspecified asthma, uncomplicated: Secondary | ICD-10-CM | POA: Diagnosis not present

## 2012-05-03 DIAGNOSIS — Z01810 Encounter for preprocedural cardiovascular examination: Secondary | ICD-10-CM | POA: Diagnosis not present

## 2012-05-03 DIAGNOSIS — Z01812 Encounter for preprocedural laboratory examination: Secondary | ICD-10-CM | POA: Diagnosis not present

## 2012-05-03 HISTORY — DX: Other skin changes: R23.8

## 2012-05-03 HISTORY — DX: Other chronic pain: G89.29

## 2012-05-03 HISTORY — DX: Effusion, unspecified joint: M25.40

## 2012-05-03 HISTORY — DX: Dermatitis, unspecified: L30.9

## 2012-05-03 HISTORY — DX: Xerosis cutis: L85.3

## 2012-05-03 HISTORY — DX: Personal history of other medical treatment: Z92.89

## 2012-05-03 HISTORY — DX: Headache: R51

## 2012-05-03 HISTORY — DX: Polyneuropathy, unspecified: G62.9

## 2012-05-03 HISTORY — DX: Anxiety disorder, unspecified: F41.9

## 2012-05-03 HISTORY — DX: Dorsalgia, unspecified: M54.9

## 2012-05-03 HISTORY — DX: Dizziness and giddiness: R42

## 2012-05-03 HISTORY — DX: Personal history of other diseases of the respiratory system: Z87.09

## 2012-05-03 HISTORY — DX: Major depressive disorder, single episode, unspecified: F32.9

## 2012-05-03 HISTORY — DX: Gastro-esophageal reflux disease without esophagitis: K21.9

## 2012-05-03 HISTORY — DX: Depression, unspecified: F32.A

## 2012-05-03 HISTORY — DX: Spontaneous ecchymoses: R23.3

## 2012-05-03 HISTORY — DX: Nonrheumatic mitral (valve) prolapse: I34.1

## 2012-05-03 HISTORY — DX: Unspecified hemorrhoids: K64.9

## 2012-05-03 HISTORY — DX: Insomnia, unspecified: G47.00

## 2012-05-03 LAB — CBC WITH DIFFERENTIAL/PLATELET
Basophils Absolute: 0 10*3/uL (ref 0.0–0.1)
Eosinophils Absolute: 0.1 10*3/uL (ref 0.0–0.7)
Lymphocytes Relative: 27 % (ref 12–46)
Lymphs Abs: 2.2 10*3/uL (ref 0.7–4.0)
MCH: 30.2 pg (ref 26.0–34.0)
Neutrophils Relative %: 66 % (ref 43–77)
Platelets: 262 10*3/uL (ref 150–400)
RBC: 4.3 MIL/uL (ref 3.87–5.11)
WBC: 8.3 10*3/uL (ref 4.0–10.5)

## 2012-05-03 LAB — URINE MICROSCOPIC-ADD ON

## 2012-05-03 LAB — SURGICAL PCR SCREEN: MRSA, PCR: NEGATIVE

## 2012-05-03 LAB — URINALYSIS, ROUTINE W REFLEX MICROSCOPIC
Glucose, UA: NEGATIVE mg/dL
Nitrite: NEGATIVE
Protein, ur: NEGATIVE mg/dL
Urobilinogen, UA: 0.2 mg/dL (ref 0.0–1.0)

## 2012-05-03 LAB — BASIC METABOLIC PANEL
Calcium: 9.2 mg/dL (ref 8.4–10.5)
GFR calc non Af Amer: >90 mL/min (ref >90)
Sodium: 136 mEq/L (ref 135–145)

## 2012-05-03 MED ORDER — CHLORHEXIDINE GLUCONATE 4 % EX LIQD: 60.0000 mL | Freq: Once | CUTANEOUS | Status: DC

## 2012-05-03 NOTE — Pre-Procedure Instructions (Signed)
19 Natalie Berg  05/03/2012   Your procedure is scheduled on:  Tues, Oct 22 @ 11:50 AM  Report to [facility] at 9:45 AM.  Call this number if you have problems the morning of surgery: [phone]   Remember:   Do not eat food:After Midnight.    Take these medicines the morning of surgery with A SIP OF WATER:Advair Albuterol(if needed)<Bring Your Inhaler With You>,Cymbalta(Duloxetine),Flonase(Fluticasone),Pain Pill(if needed),Claritin(Loratadine),Lorazepam(Ativan), and Nasonex(if needed)   Do not wear jewelry, make-up or nail polish.  Do not wear lotions, powders, or perfumes. You may wear deodorant.  Do not shave 48 hours prior to surgery.   Do not bring valuables to the hospital.  Contacts, dentures or bridgework may not be worn into surgery.  Leave suitcase in the car. After surgery it may be brought to your room.  For patients admitted to the hospital, checkout time is 11:00 AM the day of discharge.   Patients discharged the day of surgery will not be allowed to drive home.  Special Instructions: Shower using CHG 2 nights before surgery and the night before surgery.  If you shower the day of surgery use CHG.  Use special wash - you have one bottle of CHG for all showers.  You should use approximately 1/3 of the bottle for each shower.   Please read over the following fact sheets that you were given: Pain Booklet, Coughing and Deep Breathing, MRSA Information and Surgical Site Infection Prevention
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
## 2012-05-04 ENCOUNTER — Encounter (HOSPITAL_COMMUNITY): Payer: Self-pay
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
## 2012-05-10 MED ORDER — CEFAZOLIN SODIUM-DEXTROSE 2-3 GM-% IV SOLR
2.0000 g | INTRAVENOUS | Status: AC
  Administered 2012-05-11: 2 g via INTRAVENOUS
  Filled 2012-05-10: qty 50

## 2012-05-11 ENCOUNTER — Encounter (HOSPITAL_COMMUNITY): Payer: Self-pay | Admitting: Anesthesiology

## 2012-05-11 ENCOUNTER — Encounter (HOSPITAL_COMMUNITY): Payer: Self-pay | Admitting: Specialist

## 2012-05-11 ENCOUNTER — Inpatient Hospital Stay (HOSPITAL_COMMUNITY): Payer: 59 | Admitting: Anesthesiology

## 2012-05-11 ENCOUNTER — Inpatient Hospital Stay (HOSPITAL_COMMUNITY): Payer: 59

## 2012-05-11 ENCOUNTER — Encounter (HOSPITAL_COMMUNITY)
Admission: RE | Disposition: A | Discharge status: Home / Self Care | Payer: Self-pay | Source: Ambulatory Visit | Attending: Specialist

## 2012-05-11 ENCOUNTER — Inpatient Hospital Stay (HOSPITAL_COMMUNITY)
Admission: RE | Admit: 2012-05-11 | Discharge: 2012-05-13 | DRG: 473 | Disposition: A | Discharge status: Home / Self Care | Payer: 59 | Source: Ambulatory Visit | Attending: Specialist | Admitting: Specialist

## 2012-05-11 ENCOUNTER — Encounter (HOSPITAL_COMMUNITY): Payer: Self-pay | Admitting: *Deleted

## 2012-05-11 DIAGNOSIS — K219 Gastro-esophageal reflux disease without esophagitis: Secondary | ICD-10-CM | POA: Diagnosis present

## 2012-05-11 DIAGNOSIS — G609 Hereditary and idiopathic neuropathy, unspecified: Secondary | ICD-10-CM | POA: Diagnosis present

## 2012-05-11 DIAGNOSIS — IMO0002 Reserved for concepts with insufficient information to code with codable children: Secondary | ICD-10-CM | POA: Diagnosis not present

## 2012-05-11 DIAGNOSIS — Z79899 Other long term (current) drug therapy: Secondary | ICD-10-CM

## 2012-05-11 DIAGNOSIS — I059 Rheumatic mitral valve disease, unspecified: Secondary | ICD-10-CM | POA: Diagnosis present

## 2012-05-11 DIAGNOSIS — M549 Dorsalgia, unspecified: Secondary | ICD-10-CM | POA: Diagnosis not present

## 2012-05-11 DIAGNOSIS — Z981 Arthrodesis status: Secondary | ICD-10-CM

## 2012-05-11 DIAGNOSIS — F3289 Other specified depressive episodes: Secondary | ICD-10-CM | POA: Diagnosis present

## 2012-05-11 DIAGNOSIS — F329 Major depressive disorder, single episode, unspecified: Secondary | ICD-10-CM | POA: Diagnosis present

## 2012-05-11 DIAGNOSIS — F411 Generalized anxiety disorder: Secondary | ICD-10-CM | POA: Diagnosis present

## 2012-05-11 DIAGNOSIS — M5020 Other cervical disc displacement, unspecified cervical region: Principal | ICD-10-CM | POA: Diagnosis present

## 2012-05-11 DIAGNOSIS — M542 Cervicalgia: Secondary | ICD-10-CM | POA: Diagnosis not present

## 2012-05-11 DIAGNOSIS — M47812 Spondylosis without myelopathy or radiculopathy, cervical region: Secondary | ICD-10-CM | POA: Diagnosis not present

## 2012-05-11 HISTORY — PX: CERVICAL FUSION: SHX112

## 2012-05-11 HISTORY — PX: ANTERIOR CERVICAL DECOMP/DISCECTOMY FUSION: SHX1161

## 2012-05-11 SURGERY — ANTERIOR CERVICAL DECOMPRESSION/DISCECTOMY FUSION 1 LEVEL
Anesthesia: General | Site: Spine Cervical | Wound class: Clean

## 2012-05-11 MED ORDER — HYDROMORPHONE HCL PF 1 MG/ML IJ SOLN
INTRAMUSCULAR | Status: AC
  Filled 2012-05-11: qty 1

## 2012-05-11 MED ORDER — CEFAZOLIN SODIUM 1-5 GM-% IV SOLN
1.0000 g | Freq: Three times a day (TID) | INTRAVENOUS | Status: AC
  Administered 2012-05-11 – 2012-05-12 (×2): 1 g via INTRAVENOUS
  Filled 2012-05-11 (×3): qty 50

## 2012-05-11 MED ORDER — HYDROCODONE-ACETAMINOPHEN 5-325 MG PO TABS: 1.0000 | ORAL_TABLET | ORAL | Status: DC | PRN

## 2012-05-11 MED ORDER — EPHEDRINE SULFATE 50 MG/ML IJ SOLN
INTRAMUSCULAR | Status: DC | PRN
  Administered 2012-05-11 (×3): 5 mg via INTRAVENOUS

## 2012-05-11 MED ORDER — OXYCODONE-ACETAMINOPHEN 5-325 MG PO TABS
1.0000 | ORAL_TABLET | ORAL | Status: DC | PRN
Start: 2012-05-11 — End: 2012-05-12

## 2012-05-11 MED ORDER — LACTATED RINGERS IV SOLN
INTRAVENOUS | Status: DC
  Administered 2012-05-11: 11:00:00 via INTRAVENOUS

## 2012-05-11 MED ORDER — FLUTICASONE-SALMETEROL 250-50 MCG/DOSE IN AEPB
1.0000 | INHALATION_SPRAY | Freq: Two times a day (BID) | RESPIRATORY_TRACT | Status: DC
  Administered 2012-05-11 – 2012-05-12 (×3): 1 via RESPIRATORY_TRACT
  Filled 2012-05-11: qty 14

## 2012-05-11 MED ORDER — MENTHOL 3 MG MT LOZG: 1.0000 | LOZENGE | OROMUCOSAL | Status: DC | PRN

## 2012-05-11 MED ORDER — SODIUM CHLORIDE 0.9 % IV SOLN: 250.0000 mL | INTRAVENOUS | Status: DC

## 2012-05-11 MED ORDER — ACETAMINOPHEN 650 MG RE SUPP: 650.0000 mg | RECTAL | Status: DC | PRN

## 2012-05-11 MED ORDER — BUPIVACAINE-EPINEPHRINE 0.5% -1:200000 IJ SOLN
INTRAMUSCULAR | Status: DC | PRN
  Administered 2012-05-11: 17 mL

## 2012-05-11 MED ORDER — CYCLOBENZAPRINE HCL 10 MG PO TABS
10.0000 mg | ORAL_TABLET | Freq: Three times a day (TID) | ORAL | Status: DC | PRN
  Administered 2012-05-12: 10 mg via ORAL
  Filled 2012-05-11: qty 1

## 2012-05-11 MED ORDER — LACTATED RINGERS IV SOLN
INTRAVENOUS | Status: DC | PRN
  Administered 2012-05-11 (×2): via INTRAVENOUS

## 2012-05-11 MED ORDER — DEXTROSE 5 % IV SOLN
INTRAVENOUS | Status: DC | PRN
  Administered 2012-05-11: 12:00:00 via INTRAVENOUS

## 2012-05-11 MED ORDER — ONDANSETRON HCL 4 MG/2ML IJ SOLN
INTRAMUSCULAR | Status: DC | PRN
  Administered 2012-05-11: 4 mg via INTRAVENOUS

## 2012-05-11 MED ORDER — ACETAMINOPHEN 325 MG PO TABS: 650.0000 mg | ORAL_TABLET | ORAL | Status: DC | PRN

## 2012-05-11 MED ORDER — LIDOCAINE HCL (CARDIAC) 20 MG/ML IV SOLN
INTRAVENOUS | Status: DC | PRN
  Administered 2012-05-11: 80 mg via INTRAVENOUS

## 2012-05-11 MED ORDER — LORATADINE 10 MG PO TABS
10.0000 mg | ORAL_TABLET | Freq: Every day | ORAL | Status: DC | PRN
  Filled 2012-05-11: qty 1

## 2012-05-11 MED ORDER — FENTANYL CITRATE 0.05 MG/ML IJ SOLN
INTRAMUSCULAR | Status: DC | PRN
  Administered 2012-05-11 (×2): 25 ug via INTRAVENOUS
  Administered 2012-05-11: 100 ug via INTRAVENOUS

## 2012-05-11 MED ORDER — FLUTICASONE PROPIONATE 50 MCG/ACT NA SUSP
2.0000 | Freq: Every day | NASAL | Status: DC | PRN
  Filled 2012-05-11: qty 16

## 2012-05-11 MED ORDER — GELATIN ABSORBABLE MT POWD
OROMUCOSAL | Status: DC | PRN
  Administered 2012-05-11: 13:00:00 via TOPICAL

## 2012-05-11 MED ORDER — MORPHINE SULFATE 2 MG/ML IJ SOLN
1.0000 mg | INTRAMUSCULAR | Status: DC | PRN
  Administered 2012-05-11: 4 mg via INTRAVENOUS
  Administered 2012-05-11 – 2012-05-12 (×2): 2 mg via INTRAVENOUS
  Administered 2012-05-12: 4 mg via INTRAVENOUS
  Filled 2012-05-11: qty 1
  Filled 2012-05-11 (×3): qty 2

## 2012-05-11 MED ORDER — PHENYLEPHRINE HCL 10 MG/ML IJ SOLN
10.0000 mg | INTRAVENOUS | Status: DC | PRN
  Administered 2012-05-11: 10 ug/min via INTRAVENOUS

## 2012-05-11 MED ORDER — PHENOL 1.4 % MT LIQD: 1.0000 | OROMUCOSAL | Status: DC | PRN

## 2012-05-11 MED ORDER — METHOCARBAMOL 500 MG PO TABS
500.0000 mg | ORAL_TABLET | Freq: Four times a day (QID) | ORAL | Status: DC | PRN
  Filled 2012-05-11: qty 1

## 2012-05-11 MED ORDER — KCL IN DEXTROSE-NACL 20-5-0.45 MEQ/L-%-% IV SOLN
INTRAVENOUS | Status: DC
  Administered 2012-05-11: 17:00:00 via INTRAVENOUS
  Filled 2012-05-11 (×5): qty 1000

## 2012-05-11 MED ORDER — LORAZEPAM 1 MG PO TABS
1.0000 mg | ORAL_TABLET | Freq: Three times a day (TID) | ORAL | Status: DC | PRN
  Administered 2012-05-12: 2 mg via ORAL
  Filled 2012-05-11: qty 2

## 2012-05-11 MED ORDER — PROPOFOL 10 MG/ML IV BOLUS
INTRAVENOUS | Status: DC | PRN
  Administered 2012-05-11: 150 mg via INTRAVENOUS
  Administered 2012-05-11: 30 mg via INTRAVENOUS

## 2012-05-11 MED ORDER — DULOXETINE HCL 60 MG PO CPEP
120.0000 mg | ORAL_CAPSULE | Freq: Every day | ORAL | Status: DC
  Administered 2012-05-12: 120 mg via ORAL
  Filled 2012-05-11 (×2): qty 2

## 2012-05-11 MED ORDER — SENNOSIDES-DOCUSATE SODIUM 8.6-50 MG PO TABS: 1.0000 | ORAL_TABLET | Freq: Every evening | ORAL | Status: DC | PRN

## 2012-05-11 MED ORDER — HYDROCORTISONE SOD SUCCINATE 100 MG IJ SOLR
100.0000 mg | Freq: Three times a day (TID) | INTRAMUSCULAR | Status: AC
  Administered 2012-05-11 – 2012-05-12 (×3): 100 mg via INTRAVENOUS
  Filled 2012-05-11 (×3): qty 2

## 2012-05-11 MED ORDER — SODIUM CHLORIDE 0.9 % IJ SOLN
3.0000 mL | Freq: Two times a day (BID) | INTRAMUSCULAR | Status: DC
  Administered 2012-05-11 – 2012-05-12 (×2): 3 mL via INTRAVENOUS

## 2012-05-11 MED ORDER — TRAZODONE HCL 50 MG PO TABS
50.0000 mg | ORAL_TABLET | Freq: Every evening | ORAL | Status: DC | PRN
  Filled 2012-05-11: qty 2

## 2012-05-11 MED ORDER — METHOCARBAMOL 100 MG/ML IJ SOLN
500.0000 mg | Freq: Four times a day (QID) | INTRAVENOUS | Status: DC | PRN
  Filled 2012-05-11: qty 5

## 2012-05-11 MED ORDER — PREDNISONE 5 MG PO TABS
5.0000 mg | ORAL_TABLET | Freq: Every day | ORAL | Status: DC
  Filled 2012-05-11: qty 1

## 2012-05-11 MED ORDER — GLYCOPYRROLATE 0.2 MG/ML IJ SOLN
INTRAMUSCULAR | Status: DC | PRN
  Administered 2012-05-11: 0.6 mg via INTRAVENOUS
  Administered 2012-05-11: 0.2 mg via INTRAVENOUS

## 2012-05-11 MED ORDER — BUPIVACAINE-EPINEPHRINE (PF) 0.5% -1:200000 IJ SOLN
INTRAMUSCULAR | Status: AC
  Filled 2012-05-11: qty 10

## 2012-05-11 MED ORDER — SODIUM CHLORIDE 0.9 % IJ SOLN: 3.0000 mL | INTRAMUSCULAR | Status: DC | PRN

## 2012-05-11 MED ORDER — KCL IN DEXTROSE-NACL 20-5-0.45 MEQ/L-%-% IV SOLN
INTRAVENOUS | Status: AC
  Filled 2012-05-11: qty 1000

## 2012-05-11 MED ORDER — ONDANSETRON HCL 4 MG/2ML IJ SOLN: 4.0000 mg | INTRAMUSCULAR | Status: DC | PRN

## 2012-05-11 MED ORDER — ROCURONIUM BROMIDE 100 MG/10ML IV SOLN
INTRAVENOUS | Status: DC | PRN
  Administered 2012-05-11: 50 mg via INTRAVENOUS
  Administered 2012-05-11: 10 mg via INTRAVENOUS

## 2012-05-11 MED ORDER — NEOSTIGMINE METHYLSULFATE 1 MG/ML IJ SOLN
INTRAMUSCULAR | Status: DC | PRN
  Administered 2012-05-11: 4 mg via INTRAVENOUS

## 2012-05-11 MED ORDER — ARTIFICIAL TEARS OP OINT
TOPICAL_OINTMENT | OPHTHALMIC | Status: DC | PRN
  Administered 2012-05-11: 1 via OPHTHALMIC

## 2012-05-11 MED ORDER — PHENYLEPHRINE HCL 10 MG/ML IJ SOLN
INTRAMUSCULAR | Status: DC | PRN
  Administered 2012-05-11: 40 ug via INTRAVENOUS

## 2012-05-11 MED ORDER — HYDROMORPHONE HCL PF 1 MG/ML IJ SOLN
0.2500 mg | INTRAMUSCULAR | Status: DC | PRN
  Administered 2012-05-11 (×3): 0.5 mg via INTRAVENOUS

## 2012-05-11 MED ORDER — FLEET ENEMA 7-19 GM/118ML RE ENEM: 1.0000 | ENEMA | Freq: Once | RECTAL | Status: AC | PRN

## 2012-05-11 MED ORDER — THROMBIN 20000 UNITS EX SOLR
CUTANEOUS | Status: AC
  Filled 2012-05-11: qty 20000

## 2012-05-11 MED ORDER — BISACODYL 10 MG RE SUPP: 10.0000 mg | Freq: Every day | RECTAL | Status: DC | PRN

## 2012-05-11 MED ORDER — MIDAZOLAM HCL 5 MG/5ML IJ SOLN
INTRAMUSCULAR | Status: DC | PRN
  Administered 2012-05-11 (×2): 1 mg via INTRAVENOUS

## 2012-05-11 MED ORDER — ALBUTEROL SULFATE HFA 108 (90 BASE) MCG/ACT IN AERS
2.0000 | INHALATION_SPRAY | Freq: Four times a day (QID) | RESPIRATORY_TRACT | Status: DC | PRN
  Filled 2012-05-11: qty 6.7

## 2012-05-11 MED ORDER — DOCUSATE SODIUM 100 MG PO CAPS
100.0000 mg | ORAL_CAPSULE | Freq: Two times a day (BID) | ORAL | Status: DC
  Administered 2012-05-11 – 2012-05-12 (×3): 100 mg via ORAL
  Filled 2012-05-11 (×6): qty 1

## 2012-05-11 SURGICAL SUPPLY — 60 items
ADH SKN CLS APL DERMABOND .7 (GAUZE/BANDAGES/DRESSINGS) ×2
BLADE SURG ROTATE 9660 (MISCELLANEOUS) IMPLANT
BUR ROUND FLUTED 4 SOFT TCH (BURR) ×2 IMPLANT
BUR SABER RD CUTTING 3.0 (BURR) ×2 IMPLANT
CLOTH BEACON ORANGE TIMEOUT ST (SAFETY) ×2 IMPLANT
CORDS BIPOLAR (ELECTRODE) ×2 IMPLANT
COVER SURGICAL LIGHT HANDLE (MISCELLANEOUS) ×2 IMPLANT
DERMABOND ADVANCED (GAUZE/BANDAGES/DRESSINGS) ×2
DERMABOND ADVANCED .7 DNX12 (GAUZE/BANDAGES/DRESSINGS) ×1 IMPLANT
DRAIN TLS ROUND 10FR (DRAIN) IMPLANT
DRAPE C-ARM 42X72 X-RAY (DRAPES) ×2 IMPLANT
DRAPE MICROSCOPE LEICA (MISCELLANEOUS) ×2 IMPLANT
DRAPE POUCH INSTRU U-SHP 10X18 (DRAPES) ×2 IMPLANT
DRAPE SURG 17X23 STRL (DRAPES) ×6 IMPLANT
DRSG MEPILEX BORDER 4X4 (GAUZE/BANDAGES/DRESSINGS) ×2 IMPLANT
DURAPREP 6ML APPLICATOR 50/CS (WOUND CARE) ×2 IMPLANT
ELECT BLADE 4.0 EZ CLEAN MEGAD (MISCELLANEOUS)
ELECT COATED BLADE 2.86 ST (ELECTRODE) ×2 IMPLANT
ELECT REM PT RETURN 9FT ADLT (ELECTROSURGICAL) ×2
ELECTRODE BLDE 4.0 EZ CLN MEGD (MISCELLANEOUS) IMPLANT
ELECTRODE REM PT RTRN 9FT ADLT (ELECTROSURGICAL) ×1 IMPLANT
GLOVE BIOGEL PI IND STRL 7.5 (GLOVE) ×1 IMPLANT
GLOVE BIOGEL PI IND STRL 8.5 (GLOVE) IMPLANT
GLOVE BIOGEL PI INDICATOR 7.5 (GLOVE) ×1
GLOVE BIOGEL PI INDICATOR 8.5 (GLOVE) ×1
GLOVE ECLIPSE 7.0 STRL STRAW (GLOVE) ×2 IMPLANT
GLOVE ECLIPSE 8.5 STRL (GLOVE) ×2 IMPLANT
GLOVE SURG 8.5 LATEX PF (GLOVE) ×2 IMPLANT
GLOVE SURG SS PI 7.0 STRL IVOR (GLOVE) ×1 IMPLANT
GOWN PREVENTION PLUS LG XLONG (DISPOSABLE) ×1 IMPLANT
GOWN PREVENTION PLUS XXLARGE (GOWN DISPOSABLE) ×2 IMPLANT
GOWN STRL NON-REIN LRG LVL3 (GOWN DISPOSABLE) ×4 IMPLANT
HEAD HALTER (SOFTGOODS) ×2 IMPLANT
KIT BASIN OR (CUSTOM PROCEDURE TRAY) ×2 IMPLANT
KIT ROOM TURNOVER OR (KITS) ×2 IMPLANT
MANIFOLD NEPTUNE II (INSTRUMENTS) ×2 IMPLANT
NDL SPNL 20GX3.5 QUINCKE YW (NEEDLE) ×2 IMPLANT
NEEDLE SPNL 20GX3.5 QUINCKE YW (NEEDLE) ×4 IMPLANT
NS IRRIG 1000ML POUR BTL (IV SOLUTION) ×2 IMPLANT
PACK ORTHO CERVICAL (CUSTOM PROCEDURE TRAY) ×2 IMPLANT
PAD ARMBOARD 7.5X6 YLW CONV (MISCELLANEOUS) ×4 IMPLANT
PATTIES SURGICAL .5 X.5 (GAUZE/BANDAGES/DRESSINGS) ×1 IMPLANT
PEEK LORDOTIC 8MM (Peek) ×1 IMPLANT
PIN DISTRACTION 14MM (PIN) ×4 IMPLANT
SCREW SELF DRILLING 14MM (Screw) ×1 IMPLANT
SPONGE INTESTINAL PEANUT (DISPOSABLE) ×1 IMPLANT
SPONGE LAP 4X18 X RAY DECT (DISPOSABLE) ×1 IMPLANT
SPONGE SURGIFOAM ABS GEL 100 (HEMOSTASIS) ×1 IMPLANT
SUT VIC AB 2-0 CT1 27 (SUTURE) ×2
SUT VIC AB 2-0 CT1 TAPERPNT 27 (SUTURE) ×1 IMPLANT
SUT VIC AB 3-0 FS2 27 (SUTURE) ×1 IMPLANT
SUT VIC AB 4-0 PS2 27 (SUTURE) ×1 IMPLANT
SUT VICRYL 4-0 PS2 18IN ABS (SUTURE) ×2 IMPLANT
SYR 30ML LL (SYRINGE) ×2 IMPLANT
SYSTEM CHEST DRAIN TLS 7FR (DRAIN) IMPLANT
TOWEL OR 17X24 6PK STRL BLUE (TOWEL DISPOSABLE) ×2 IMPLANT
TOWEL OR 17X26 10 PK STRL BLUE (TOWEL DISPOSABLE) ×2 IMPLANT
TRAY FOLEY CATH 14FR (SET/KITS/TRAYS/PACK) IMPLANT
WATER STERILE IRR 1000ML POUR (IV SOLUTION) ×1 IMPLANT
ZERO PVA SELF DRILLING SCREW 14MM ×1 IMPLANT

## 2012-05-11 NOTE — H&P (Signed)
Patient was seen and examined in the preop holding area. There has been no interval  Change in this patient's exam preop  history and physical exam  Lab tests and images have been examined and reviewed.  The Risks benefits and alternative treatments have been discussed  extensively,questions answered.  The patient has elected to undergo the discussed surgical treatment. 

## 2012-05-11 NOTE — Transfer of Care (Signed)
Immediate Anesthesia Transfer of Care Note  Patient: Natalie Berg  Procedure(s) Performed: Procedure(s) (LRB) with comments: ANTERIOR CERVICAL DECOMPRESSION/DISCECTOMY FUSION 1 LEVEL (N/A) - C3-4 ACDF with Zero-P Synthes Implant  Patient Location: PACU  Anesthesia Type: General  Level of Consciousness: awake, oriented, patient cooperative and responds to stimulation  Airway & Oxygen Therapy: Patient Spontanous Breathing and Patient connected to nasal cannula oxygen  Post-op Assessment: Report given to PACU RN and Post -op Vital signs reviewed and stable  Post vital signs: Reviewed and stable  Complications: No apparent anesthesia complications

## 2012-05-11 NOTE — Anesthesia Preprocedure Evaluation (Addendum)
Anesthesia Evaluation  Patient identified by MRN, date of birth, ID band Patient awake    Reviewed: Allergy & Precautions, H&P , NPO status , Patient's Chart, lab work & pertinent test results  Airway Mallampati: II      Dental   Pulmonary asthma ,  breath sounds clear to auscultation        Cardiovascular + Valvular Problems/Murmurs MVP Rhythm:Regular Rate:Normal     Neuro/Psych  Headaches, Anxiety Depression  Neuromuscular disease    GI/Hepatic Neg liver ROS, GERD-  Controlled,  Endo/Other  negative endocrine ROS  Renal/GU negative Renal ROS     Musculoskeletal negative musculoskeletal ROS (+)   Abdominal   Peds  Hematology negative hematology ROS (+)   Anesthesia Other Findings   Reproductive/Obstetrics negative OB ROS                          Anesthesia Physical Anesthesia Plan  ASA: III  Anesthesia Plan: General   Post-op Pain Management:    Induction: Intravenous  Airway Management Planned: Oral ETT  Additional Equipment:   Intra-op Plan:   Post-operative Plan: Extubation in OR  Informed Consent: I have reviewed the patients History and Physical, chart, labs and discussed the procedure including the risks, benefits and alternatives for the proposed anesthesia with the patient or authorized representative who has indicated his/her understanding and acceptance.   Dental advisory given  Plan Discussed with: Anesthesiologist, Surgeon and CRNA  Anesthesia Plan Comments:        Anesthesia Quick Evaluation

## 2012-05-11 NOTE — Plan of Care (Signed)
Problem: Consults Goal: Diagnosis - Spinal Surgery Cervical Spine Fusion C3-4

## 2012-05-11 NOTE — Anesthesia Postprocedure Evaluation (Signed)
  Anesthesia Post-op Note  Patient: Natalie Berg  Procedure(s) Performed: Procedure(s) (LRB) with comments: ANTERIOR CERVICAL DECOMPRESSION/DISCECTOMY FUSION 1 LEVEL (N/A) - C3-4 ACDF with Zero-P Synthes Implant  Patient Location: PACU  Anesthesia Type: General  Level of Consciousness: awake, oriented, sedated and patient cooperative  Airway and Oxygen Therapy: Patient Spontanous Breathing  Post-op Pain: mild  Post-op Assessment: Post-op Vital signs reviewed, Patient's Cardiovascular Status Stable, Respiratory Function Stable, Patent Airway, No signs of Nausea or vomiting and Pain level controlled  Post-op Vital Signs: stable  Complications: No apparent anesthesia complications

## 2012-05-11 NOTE — Op Note (Signed)
05/11/2012  2:43 PM  PATIENT:  Natalie Berg  41 y.o. 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    SURGEON:  Kerrin Champagne, MD     ASSISTANT:  Maud Deed, PA-C  (Present throughout the entire procedure and necessary for completion of procedure in a timely manner)     ANESTHESIA:  General,local with marcaine 1/2% with 1/200,000 epi 12 cc    COMPLICATIONS:  None.     COMPONENTS: Synthes Depuy Zero-P implant 8 mm lordotic with 2x 14mm self drilling screws  PROCEDURE:The patient was met in the holding area, and the appropriate  left C3-4 cervical level identified and marked with an "x" and my initials. The patient was then transported to OR and was placed on the operative table in a supine position head supported on the well padded Mayfield horseshoe. The patient was then placed under  general anesthesia without difficulty intubated atraumaticly.  Cervical spine was positioned with a Mayfield horseshoe and 5 pound cervical halter traction. All pressure points well padded and semi-beach chair position. Arm metal sleds both arms. Bump under the the right buttock.Standard prep with DuraPrep solution the anterior cervical spine and upper left chest and the right iliac crest. Draped in the usual manner. Iodine vi drape was used both areas.. Standard timeout protocol was carried out identifying the patient procedure side of the procedure and level.The right iliac crest skin infiltrated with Marcaine 1/2% with 1/200,000 epi 5 cc.The skin the left neck was infiltrated with Marcaine with epinephrine total of 7 cc.This at the level of expected C3-4 incision and also along a skin crease in line with the patients lines  of Langer. Right iliac crest bone graft was harvested through a separate incision the incision line over the posterior aspect of previous incision scar used for harvesting right iliac crest bone graft anteriorly during previous surgery in 2006. Incision approximately 3/4 of an inch in length through the skin and subcutaneous layers directly down to the superficial iliac crest. The iliac crest was then exposed and self-retaining retractors placed. Quarter-inch osteotome then used to make a window over the lateral aspect of the previous edge of bone graft resection site. A window of cortical bone approximately 1 cm x 1 cm was removed and additional bone removed from the superior lateral aspect of the crest and then cancellus bone using quarter inch osteotome. Irrigation then performed over the right iliac crest bone graft harvest site and then this area was packed with normal saline sponge. Attention then returned to the anterior cervical spine site. And Incision transverse at the C3 level and carried down to the level of the platysma. Then was carried down to the anterior aspect of the sternocleidomastoid muscle. The interval between the trachea and esophagus medially and the carotid sheath laterally was developed as a Metzenbaum scissors and blunt dissection exposing the anterior aspect of cervical spine at the C3-4 level.  The prevertebral fascia anterior to the cervical was cauterized with bipolar and teased across the midline with a Barista. The superior aspect of the C4-6 plate at the C4 level identified and spurs removed off the anterior aspect of the C3-4. An 18-gauge spinal needle was placed with sheath intact allowing only a centimeter to extend  into the C3-4 disc and observed on lateral radiogragh  at the C3-4 level. Handheld Cloward retraction of the soft tissues while identifying the level at C3-4 and also while removing a portion of the anterior aspect of the disc with15 blade scalpel and  pituitary. Medial border of the longus collie muscles was carefully elevated bilaterally and self-retaining Boss retractors were introduced the foot of the blade beneath the medial border of the longus colli muscles. Soft tissue overlying the anterior borders of the disc space at C3-4 level carefully debridement of soft tissue back to bony edges. The anterior lip osteophytes were then resected using rongeur. This bone was preserved for later bone grafting purposes. The disc space was then first prepared using loupe magnification and headlight with resection of degenerative disc annulus anteriorly and posteriorly and cartilaginous endplates using micro-curettes pituitaries and a high-speed bur. The operating room microscope was draped sterilely and brought into the field. Under the operating room microscope and posterior aspect of the disc was excised using micro-curettes pituitary rongeur and times per. Posterior lip osteophytes were drilled using a high-speed bur and a carefully resected with 1 and 2 mm Kerrison foraminotomy performed over both C4 nerve roots using 1 and 2 mm Kerrisons disc herniation material was noted centrally and into the right foramen some of which represented reflected portion of the patient's annulus into the neuroforamen. Following decompression of the spinal cord and both C4 nerve roots, irrigation was carried out. The endplates of the inferior aspect of C3 and superior aspect of C4 were carefully prepared using a high-speed bur to parallel. A laminar spreader as well as cervical traction using head halter as well as a traction on the patient's chin allowed for distraction of the disc space both when debriding and also when placing trial implants. The depth of the disc space measured using the Cloward depth gauge measured at 14 mm. The disc space was then sounded utilized and a precontoured sounder for the Zero-P implant. Surrounding was carried up to a 8 mm implant.  8 mm Zero-P spacer  was felt to provide best fit for the C3-4 disc space. And an and and and and and and and and and and and and and and and and and and and and and and and and and and and and and and and and and and and and and and and and and is in using a he does but it is getting no purchase at the level screws and is in the other vein daily and the third and a and a and maybe a keep an and and is as as I said that she is A left sided screw at the superior left C4 level was removed by turning the locking mechanism using the flat instrument and then unscrewed the screw from the left upper aspect of the cervical plate. Cervical distraction using distraction screw post placed into the vertebral body of C3 and left open screw hole after removal of screw from the left upper cervical plate allowed for enough distraction to place the Zero-P. implant. C arm flouro used to confirm the position and alignment. The  permanent 8 mm lordotic Zero-P implant was then brought onto the field. It was then packed with local bone graft that had been harvested previously from the right iliac crest. The implant was then carefully placed over the intervertebral disc space at C3-4 level. Care taken to ensure that no bone or soft tissue debris within the disc space that could be  retropulsed with insertion of the cage and bone graft. The implant was then impacted into place with the head placed in longitudinal cervical traction.  The implant was felt to be in excellent position alignment. Examined on C-arm fluoroscopy to be well positioned. No sign of retropulsion of the implant. A flange over the anterior left side of the plate device did impinge on the anterior upper surface of the retained cervical plate. This plate was carefully positioned and then able to be impacted seating the flange just above the superior aspect of the plate. The awl was then introduced on the left-hand side of the plate directed downward into the vertebral body of C4. An awl was  able to be fully seated and then a 14 mm screw introduced into the left hand opening and draped downwards and then this was screwed into place. Cervical traction was then fully released. And the screw on the right-hand side of the Zero-p implant.first using the awl seating  the awl fully then using the self drilling 14 mm screw. C-arm fluoroscopy demonstrated the screws in good position alignment both AP and lateral planes. Permanent C-arm images were For documentation purposes. Cervical distraction instrumentation was removed and the C4 screw post hole coated with wax for hemostasis.Intraoperative lateral  and AP C-arm  demonstrated the Zero-P implant and screws in good position alignment no sign of impingement upon the cervical canal.  Additional graft  was  positioned within the screw hole left upper Depuy Slimline plate.  This point irrigation was carried out at cervical incision site.The esophagus examined at the cervical level  and found to be normal. Irrigation was again carried out there was no active bleeding present. The incisions were then closed by approximating the deep subcutaneous layers the platysma layer with interrupted 2-0 Vicryl suture and the superficial fascia overlying the sternocleidomastoid muscle with interrupted 3-0 Vicryl sutures. The subcutaneous layers were approximated with interrupted 3-0 Vicryl sutures as were the superficial layers. The skin was closed with a running subcutaneous stitch of 4-0 Vicryl at the operative C3-4 transverse incision site.  Right iliac crest bone graft harvest site irrigated with copious amounts of irrigant solution superficial fascial layers closed with interrupted 2-0 Vicryl sutures, subcutaneous layers approximated with interrupted 2-0 and 3-0 Vicryl sutures and skin closed with interrupted 4-0 Vicryl sutures. Dermabond and MedPlex bandage applied.Skin was approximated with Dermabond. Mepilex bandage was applied. A  soft cervical collar was then applied  to the cervical spine. The patient was then reactivated and extubated and returned to the recovery room in satisfactory condition. All instrument and sponge counts were correct. Foley catheter in and out at the end of the procedure.    Tyresse Jayson E 05/11/2012, 2:43 PM

## 2012-05-11 NOTE — Brief Op Note (Addendum)
05/11/2012  2:37 PM  PATIENT:  Natalie Berg  41 y.o. 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 Right iliac crest bone graft harvest through  Separate incision.  SURGEON:  Surgeon(s) and Role:    * Kerrin Champagne, MD - Primary  PHYSICIAN ASSISTANT: Maud Deed, PA-C  ANESTHESIA:   local, general and 12cc Marcaine 1/2% with 1/200,000 epi.  Dr. Randa Evens Dr. Diamantina Monks  EBL:  Total I/O In: 1550 [I.V.:1550] Out: 50 [Blood:50]  BLOOD ADMINISTERED:none  DRAINS: Urinary Catheter (Foley) In and Out cath at the end of the case LOCAL MEDICATIONS USED:  MARCAINE    and Amount: 12 ml  SPECIMEN:  No Specimen  DISPOSITION OF SPECIMEN:  N/A  COUNTS:  YES  TOURNIQUET:  * No tourniquets in log *  DICTATION: .Dragon Dictation  PLAN OF CARE: Admit to inpatient   PATIENT DISPOSITION:  PACU - hemodynamically stable.   Delay start of Pharmacological VTE agent (>24hrs) due to surgical blood loss or risk of bleeding: yes

## 2012-05-11 NOTE — Preoperative (Signed)
Beta Blockers   Reason not to administer Beta Blockers:Not Applicable 

## 2012-05-12 ENCOUNTER — Other Ambulatory Visit: Payer: Medicare Other

## 2012-05-12 ENCOUNTER — Encounter (HOSPITAL_COMMUNITY): Payer: Self-pay | Admitting: Specialist

## 2012-05-12 MED ORDER — HYDROMORPHONE HCL 2 MG PO TABS
2.0000 mg | ORAL_TABLET | ORAL | Status: DC | PRN
  Administered 2012-05-12 – 2012-05-13 (×5): 2 mg via ORAL
  Filled 2012-05-12 (×6): qty 1

## 2012-05-12 MED ORDER — OXYCODONE HCL 15 MG PO TABS: 15.0000 mg | ORAL_TABLET | Freq: Four times a day (QID) | ORAL | Status: DC | PRN

## 2012-05-12 MED ORDER — OXYCODONE HCL 5 MG PO TABS
15.0000 mg | ORAL_TABLET | ORAL | Status: DC | PRN
  Administered 2012-05-12 – 2012-05-13 (×5): 15 mg via ORAL
  Filled 2012-05-12 (×5): qty 3

## 2012-05-12 MED ORDER — HYDROMORPHONE HCL 2 MG PO TABS: 2.0000 mg | ORAL_TABLET | Freq: Four times a day (QID) | ORAL | Status: DC | PRN

## 2012-05-12 MED ORDER — METHOCARBAMOL 500 MG PO TABS
500.0000 mg | ORAL_TABLET | Freq: Three times a day (TID) | ORAL | Status: DC
Start: 2012-05-12 — End: 2013-03-01

## 2012-05-12 NOTE — Progress Notes (Signed)
Subjective: 1 Day Post-Op Procedure(s) (LRB): ANTERIOR CERVICAL DECOMPRESSION/DISCECTOMY FUSION 1 LEVEL (N/A) Patient reports pain as severe.  No arm pain.  Mostly in posterior neck. Morphine has been helpful.  Pt reports she doesn't tolerated vicodin or percocet for severe pain as it doesn't help her. Has been out of bed to bathroom.  Pt with chronic pain and depressive disorder.  No assistance at home and no one to take her home today.  She is very concerned about discharge with her pain uncontrolled.  Objective: Vital signs in last 24 hours: Temp:  [97.4 F (36.3 C)-99 F (37.2 C)] 98.6 F (37 C) (10/23 0622) Pulse Rate:  [72-90] 88  (10/23 0622) Resp:  [16-18] 18  (10/23 0622) BP: (106-148)/(49-69) 148/66 mmHg (10/23 0622) SpO2:  [96 %-100 %] 99 % (10/23 0622)  Intake/Output from previous day: 10/22 0701 - 10/23 0700 In: 2766.8 [I.V.:2766.8] Out: 100 [Urine:50; Blood:50] Intake/Output this shift:    No results found for this basename: HGB:5 in the last 72 hours No results found for this basename: WBC:2,RBC:2,HCT:2,PLT:2 in the last 72 hours No results found for this basename: NA:2,K:2,CL:2,CO2:2,BUN:2,CREATININE:2,GLUCOSE:2,CALCIUM:2 in the last 72 hours No results found for this basename: LABPT:2,INR:2 in the last 72 hours  Neurovascular intact Incision: dressing C/D/I  Assessment/Plan: 1 Day Post-Op Procedure(s) (LRB): ANTERIOR CERVICAL DECOMPRESSION/DISCECTOMY FUSION 1 LEVEL (N/A) Plan for discharge tomorrow Will try Dilaudid today at low dose to see if she tolerates this.  Also try OXY IR as she has not taken this before and it may be more helpful than percocet.  Out of bed today for ambulation. Will check later today. Natalie Berg M 05/12/2012, 8:39 AM

## 2012-05-12 NOTE — Progress Notes (Signed)
Patient examined and lab reviewed with Vernon PA-C. 

## 2012-05-12 NOTE — Progress Notes (Signed)
UR COMPLETED  
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
## 2012-05-13 NOTE — Progress Notes (Signed)
Subjective: 2 Days Post-Op Procedure(s) (LRB): ANTERIOR CERVICAL DECOMPRESSION/DISCECTOMY FUSION 1 LEVEL (N/A) Patient reports pain as 5 on 0-10 scale.  Sore throat, some mild upper respiratory congestion, she note she had preop.  Objective: Vital signs in last 24 hours: Temp:  [98.9 F (37.2 C)-99.4 F (37.4 C)] 98.9 F (37.2 C) (10/24 0639) Pulse Rate:  [84-89] 84  (10/24 0639) Resp:  [18] 18  (10/24 0639) BP: (115-128)/(59-75) 128/75 mmHg (10/24 0639) SpO2:  [98 %-99 %] 99 % (10/24 0639)  Intake/Output from previous day: 10/23 0701 - 10/24 0700 In: 1180 [P.O.:1180] Out: -  Intake/Output this shift:    No results found for this basename: HGB:5 in the last 72 hours No results found for this basename: WBC:2,RBC:2,HCT:2,PLT:2 in the last 72 hours No results found for this basename: NA:2,K:2,CL:2,CO2:2,BUN:2,CREATININE:2,GLUCOSE:2,CALCIUM:2 in the last 72 hours No results found for this basename: LABPT:2,INR:2 in the last 72 hours  Neurologically intact ABD soft Neurovascular intact Sensation intact distally Intact pulses distally Incision: no drainage and Incision left neck with out induration, some minimal erythrema about area of dermabond No cellulitis present Right iliac crest bone graft harvest site without erythrema or drainage no swelling.  Assessment/Plan: 2 Days Post-Op Procedure(s) (LRB): ANTERIOR CERVICAL DECOMPRESSION/DISCECTOMY FUSION 1 LEVEL (N/A)  Advance diet Up with therapy D/C IV fluids Plan for discharge today. Recommend a decongestant and robitussin to thin secretions and decrease any sinus drainage. IS for minimal elevation of temperature.  Benyamin Jeff E 05/13/2012, 7:29 AM

## 2012-05-17 ENCOUNTER — Ambulatory Visit
Admission: RE | Admit: 2012-05-17 | Discharge: 2012-05-17 | Disposition: A | Discharge status: Home / Self Care | Payer: 59 | Source: Ambulatory Visit | Attending: Physician Assistant | Admitting: Physician Assistant

## 2012-05-17 DIAGNOSIS — M47817 Spondylosis without myelopathy or radiculopathy, lumbosacral region: Secondary | ICD-10-CM | POA: Diagnosis not present

## 2012-05-17 DIAGNOSIS — M549 Dorsalgia, unspecified: Secondary | ICD-10-CM

## 2012-05-17 DIAGNOSIS — M545 Low back pain: Secondary | ICD-10-CM | POA: Diagnosis not present

## 2012-05-17 MED ORDER — IOHEXOL 180 MG/ML  SOLN
1.0000 mL | Freq: Once | INTRAMUSCULAR | Status: AC | PRN
  Administered 2012-05-17: 1 mL via EPIDURAL

## 2012-05-17 MED ORDER — METHYLPREDNISOLONE ACETATE 40 MG/ML INJ SUSP (RADIOLOG
120.0000 mg | Freq: Once | INTRAMUSCULAR | Status: AC
  Administered 2012-05-17: 120 mg via EPIDURAL

## 2012-06-15 DIAGNOSIS — M7520 Bicipital tendinitis, unspecified shoulder: Secondary | ICD-10-CM | POA: Diagnosis not present

## 2012-07-29 ENCOUNTER — Encounter: Payer: Self-pay | Admitting: Obstetrics and Gynecology

## 2012-07-29 ENCOUNTER — Ambulatory Visit (INDEPENDENT_AMBULATORY_CARE_PROVIDER_SITE_OTHER): Payer: 59 | Admitting: Obstetrics and Gynecology

## 2012-07-29 VITALS — BP 90/60 | HR 68 | Resp 16 | Ht 68.0 in | Wt 112.0 lb

## 2012-07-29 DIAGNOSIS — Z01419 Encounter for gynecological examination (general) (routine) without abnormal findings: Secondary | ICD-10-CM

## 2012-07-29 DIAGNOSIS — Z124 Encounter for screening for malignant neoplasm of cervix: Secondary | ICD-10-CM

## 2012-07-29 NOTE — Progress Notes (Signed)
ANNUAL GYNECOLOGIC EXAMINATION   Natalie Berg is a 42 y.o. female, No obstetric history on file., who presents for an annual exam.   The patient has had spinal fusion surgery.  This has helped.  Her cycles have been slightly irregular.  Her partner has had a vasectomy.    History   Social History  . Marital Status: Divorced    Spouse Name: N/A    Number of Children: N/A  . Years of Education: N/A   Occupational History  . disabled    Social History Main Topics  . Smoking status: Never Smoker   . Smokeless tobacco: None  . Alcohol Use: No  . Drug Use: No  . Sexually Active: Yes    Birth Control/ Protection: None     Comment: Boyfriend Vas   Other Topics Concern  . None   Social History Narrative  . None    Menstrual cycle:   LMP: Patient's last menstrual period was 07/12/2012.             The following portions of the patient's history were reviewed and updated as appropriate: allergies, current medications, past family history, past medical history, past social history, past surgical history and problem list.  Review of Systems Pertinent items are noted in HPI. Breast:Negative for breast lump,nipple discharge or nipple retraction Gastrointestinal: Negative for abdominal pain, change in bowel habits or rectal bleeding Urinary:negative   Objective:    BP 90/60  Pulse 68  Resp 16  Ht 5\' 8"  (1.727 m)  Wt 112 lb (50.803 kg)  BMI 17.03 kg/m2  LMP 07/12/2012    Weight:  Wt Readings from Last 1 Encounters:  07/29/12 112 lb (50.803 kg)          BMI: Body mass index is 17.03 kg/(m^2).  General Appearance: Alert, appropriate appearance for age. No acute distress HEENT: Grossly normal Neck / Thyroid: Supple, no masses, nodes or enlargement Lungs: clear to auscultation bilaterally Back: No CVA tenderness Breast Exam: No masses or nodes.No dimpling, nipple retraction or discharge. Cardiovascular: Regular rate and rhythm. S1, S2, no murmur Gastrointestinal: Soft,  non-tender, no masses or organomegaly  ++++++++++++++++++++++++++++++++++++++++++++++++++++++++  Pelvic Exam: External genitalia: normal general appearance Vaginal: normal without tenderness, induration or masses. Relaxation: No Cervix: normal appearance Adnexa: normal bimanual exam Uterus: normal size, shape, and consistency Rectovaginal: normal rectal, no masses  ++++++++++++++++++++++++++++++++++++++++++++++++++++++++  Lymphatic Exam: Non-palpable nodes in neck, clavicular, axillary, or inguinal regions Neurologic: Normal speech, no tremor  Psychiatric: Alert and oriented, appropriate affect.  Assessment:    Normal gyn exam   Overweight or obese: No   Pelvic relaxation: No  Chronic back and neck pain.  She has had spinal fusion surgery.   Plan:    mammogram pap smear return annually or prn Contraception:vasectomy    Medications prescribed: none  STD screen request: No   The updated Pap smear screening guidelines were discussed with the patient. The patient requested that I obtain a Pap smear: Yes.  Kegel exercises discussed: Yes.  Proper diet and regular exercise were reviewed.  Annual mammograms recommended starting at age 40. Proper breast care was discussed.  Screening colonoscopy is recommended beginning at age 52.  Regular health maintenance was reviewed.  Sleep hygiene was discussed.  Adequate calcium and vitamin D intake was emphasized.  Leonard Schwartz M.D.    Regular Periods: yes Mammogram: no  Monthly Breast Ex.: yes Exercise: yes  Tetanus < 10 years: yes Seatbelts: yes  NI. Bladder Functn.: yes Abuse  at home: no  Daily BM's: yes Stressful Work: yes  Healthy Diet: yes Sigmoid-Colonoscopy: None  Calcium: no Medical problems this year: Cervical fusion    LAST PAP:03/18/2011  Contraception: None--Boyfriend Vas  Mammogram:  2008 WNL  PCP: Johny Blamer   PMH: Cervical Fusion L 3&4  FMH: None  Last Bone Scan:  None

## 2012-07-30 LAB — PAP IG W/ RFLX HPV ASCU

## 2012-08-06 ENCOUNTER — Telehealth: Payer: Self-pay

## 2012-08-06 NOTE — Telephone Encounter (Signed)
Pt called to schedule colpo.  Pt not ava  LMOVM for pt to call back.  [facility] CMA

## 2012-08-11 DIAGNOSIS — M47812 Spondylosis without myelopathy or radiculopathy, cervical region: Secondary | ICD-10-CM | POA: Diagnosis not present

## 2012-08-11 DIAGNOSIS — M5020 Other cervical disc displacement, unspecified cervical region: Secondary | ICD-10-CM | POA: Diagnosis not present

## 2012-08-25 ENCOUNTER — Telehealth: Payer: Self-pay | Admitting: Obstetrics and Gynecology

## 2012-08-25 NOTE — Telephone Encounter (Signed)
Spoke with pt's mom (betty). Advised her that the results are not signed from [facility] . Pt will call back in a few days to get results.

## 2012-09-06 DIAGNOSIS — M47812 Spondylosis without myelopathy or radiculopathy, cervical region: Secondary | ICD-10-CM | POA: Diagnosis not present

## 2012-09-06 DIAGNOSIS — M5020 Other cervical disc displacement, unspecified cervical region: Secondary | ICD-10-CM | POA: Diagnosis not present

## 2012-09-06 DIAGNOSIS — M5137 Other intervertebral disc degeneration, lumbosacral region: Secondary | ICD-10-CM | POA: Diagnosis not present

## 2012-09-06 DIAGNOSIS — M961 Postlaminectomy syndrome, not elsewhere classified: Secondary | ICD-10-CM | POA: Diagnosis not present

## 2012-10-04 DIAGNOSIS — M5137 Other intervertebral disc degeneration, lumbosacral region: Secondary | ICD-10-CM | POA: Diagnosis not present

## 2012-10-04 DIAGNOSIS — M47812 Spondylosis without myelopathy or radiculopathy, cervical region: Secondary | ICD-10-CM | POA: Diagnosis not present

## 2013-01-07 ENCOUNTER — Telehealth: Payer: Self-pay | Admitting: Internal Medicine

## 2013-01-07 NOTE — Telephone Encounter (Signed)
°  Called patient and left messages x3. Sent letter 01/07/13 °

## 2013-02-06 ENCOUNTER — Other Ambulatory Visit: Payer: Self-pay | Admitting: Internal Medicine

## 2013-03-01 ENCOUNTER — Encounter: Payer: Self-pay | Admitting: Internal Medicine

## 2013-03-01 ENCOUNTER — Ambulatory Visit (INDEPENDENT_AMBULATORY_CARE_PROVIDER_SITE_OTHER): Payer: 59 | Admitting: Internal Medicine

## 2013-03-01 VITALS — BP 114/76 | HR 71 | Ht 67.0 in | Wt 119.0 lb

## 2013-03-01 DIAGNOSIS — J45909 Unspecified asthma, uncomplicated: Secondary | ICD-10-CM

## 2013-03-01 DIAGNOSIS — J45998 Other asthma: Secondary | ICD-10-CM

## 2013-03-01 MED ORDER — FLUTICASONE-SALMETEROL 250-50 MCG/DOSE IN AEPB: INHALATION_SPRAY | RESPIRATORY_TRACT | Status: DC

## 2013-03-01 MED ORDER — FLUTICASONE PROPIONATE 50 MCG/ACT NA SUSP: 2.0000 | Freq: Every day | NASAL | Status: DC | PRN

## 2013-03-01 NOTE — Patient Instructions (Addendum)
Sample and script Flovent 110 inhaler   Try 2 puffs and rinse mouth twice daily- as a supplement if needed, as discussed  Continue Advair 500 1 puff and rinse twice daily   Script for fluticasone nasal spray
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
## 2013-03-17 NOTE — Assessment & Plan Note (Signed)
Other than going to Advair 500, long-term basis, because of cost, we are going to try using Flovent 110 as a supplement when needed, while continuing Advair 250. That may end up being cheaper for her, and with less long-term steroid exposure.

## 2013-07-18 ENCOUNTER — Other Ambulatory Visit: Payer: Self-pay | Admitting: Internal Medicine

## 2014-03-03 ENCOUNTER — Ambulatory Visit: Payer: 59 | Admitting: Internal Medicine

## 2014-03-06 ENCOUNTER — Ambulatory Visit: Payer: 59 | Admitting: Internal Medicine

## 2014-03-09 ENCOUNTER — Encounter: Payer: Self-pay | Admitting: Internal Medicine

## 2014-03-09 ENCOUNTER — Ambulatory Visit (INDEPENDENT_AMBULATORY_CARE_PROVIDER_SITE_OTHER): Payer: 59 | Admitting: Internal Medicine

## 2014-03-09 ENCOUNTER — Encounter (INDEPENDENT_AMBULATORY_CARE_PROVIDER_SITE_OTHER): Payer: Self-pay

## 2014-03-09 VITALS — BP 114/70 | HR 92 | Ht 67.0 in | Wt 126.0 lb

## 2014-03-09 DIAGNOSIS — J45998 Other asthma: Secondary | ICD-10-CM

## 2014-03-09 MED ORDER — EPINEPHRINE 0.3 MG/0.3ML IJ SOAJ
0.3000 mg | Freq: Once | INTRAMUSCULAR | Status: DC
Start: 2014-03-09 — End: 2016-01-17

## 2014-03-09 MED ORDER — FLUTICASONE-SALMETEROL 250-50 MCG/DOSE IN AEPB: INHALATION_SPRAY | RESPIRATORY_TRACT | Status: DC

## 2014-03-09 MED ORDER — ALBUTEROL SULFATE HFA 108 (90 BASE) MCG/ACT IN AERS: INHALATION_SPRAY | RESPIRATORY_TRACT | Status: DC

## 2014-03-09 MED ORDER — FLUTICASONE PROPIONATE 50 MCG/ACT NA SUSP: 2.0000 | Freq: Every day | NASAL | Status: AC | PRN
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
## 2014-03-09 NOTE — Patient Instructions (Signed)
Scripts sent for Advair, Flonase. Proair and Epipen  Sample Advair 250  Sample Breo Ellipta 1 puff then rinse mouth one time daily    Try this instead of Advair, then go back to Advair for comparison

## 2014-08-02 DIAGNOSIS — Z01411 Encounter for gynecological examination (general) (routine) with abnormal findings: Secondary | ICD-10-CM | POA: Diagnosis not present

## 2014-08-02 DIAGNOSIS — R87619 Unspecified abnormal cytological findings in specimens from cervix uteri: Secondary | ICD-10-CM | POA: Diagnosis not present

## 2014-08-02 DIAGNOSIS — F332 Major depressive disorder, recurrent severe without psychotic features: Secondary | ICD-10-CM | POA: Diagnosis not present

## 2014-08-02 DIAGNOSIS — Z124 Encounter for screening for malignant neoplasm of cervix: Secondary | ICD-10-CM | POA: Diagnosis not present

## 2014-08-02 DIAGNOSIS — R87612 Low grade squamous intraepithelial lesion on cytologic smear of cervix (LGSIL): Secondary | ICD-10-CM | POA: Diagnosis not present

## 2014-08-02 DIAGNOSIS — N870 Mild cervical dysplasia: Secondary | ICD-10-CM | POA: Diagnosis not present

## 2014-08-16 DIAGNOSIS — M5136 Other intervertebral disc degeneration, lumbar region: Secondary | ICD-10-CM | POA: Diagnosis not present

## 2014-08-16 DIAGNOSIS — M4722 Other spondylosis with radiculopathy, cervical region: Secondary | ICD-10-CM | POA: Diagnosis not present

## 2014-08-21 ENCOUNTER — Other Ambulatory Visit: Payer: Self-pay | Admitting: Specialist

## 2014-08-21 DIAGNOSIS — M542 Cervicalgia: Secondary | ICD-10-CM

## 2014-08-22 DIAGNOSIS — R87619 Unspecified abnormal cytological findings in specimens from cervix uteri: Secondary | ICD-10-CM | POA: Diagnosis not present

## 2014-08-22 DIAGNOSIS — N871 Moderate cervical dysplasia: Secondary | ICD-10-CM | POA: Diagnosis not present

## 2014-08-22 DIAGNOSIS — N870 Mild cervical dysplasia: Secondary | ICD-10-CM | POA: Diagnosis not present

## 2014-08-22 DIAGNOSIS — Z139 Encounter for screening, unspecified: Secondary | ICD-10-CM | POA: Diagnosis not present

## 2014-08-23 ENCOUNTER — Other Ambulatory Visit: Payer: Self-pay | Admitting: Specialist

## 2014-08-23 DIAGNOSIS — M47812 Spondylosis without myelopathy or radiculopathy, cervical region: Secondary | ICD-10-CM

## 2014-09-10 ENCOUNTER — Other Ambulatory Visit: Payer: Self-pay

## 2014-09-24 ENCOUNTER — Ambulatory Visit
Admission: RE | Admit: 2014-09-24 | Discharge: 2014-09-24 | Disposition: A | Discharge status: Home / Self Care | Payer: Self-pay | Source: Ambulatory Visit | Attending: Specialist | Admitting: Specialist

## 2014-09-24 DIAGNOSIS — M542 Cervicalgia: Secondary | ICD-10-CM

## 2014-09-24 DIAGNOSIS — M4322 Fusion of spine, cervical region: Secondary | ICD-10-CM | POA: Diagnosis not present

## 2014-09-24 DIAGNOSIS — M5032 Other cervical disc degeneration, mid-cervical region: Secondary | ICD-10-CM | POA: Diagnosis not present

## 2014-09-24 MED ORDER — GADOBENATE DIMEGLUMINE 529 MG/ML IV SOLN
11.0000 mL | Freq: Once | INTRAVENOUS | Status: AC | PRN
  Administered 2014-09-24: 11 mL via INTRAVENOUS

## 2014-10-10 DIAGNOSIS — M4722 Other spondylosis with radiculopathy, cervical region: Secondary | ICD-10-CM | POA: Diagnosis not present

## 2014-10-10 DIAGNOSIS — M5136 Other intervertebral disc degeneration, lumbar region: Secondary | ICD-10-CM | POA: Diagnosis not present

## 2014-10-18 ENCOUNTER — Ambulatory Visit
Admission: RE | Admit: 2014-10-18 | Discharge: 2014-10-18 | Disposition: A | Discharge status: Home / Self Care | Payer: Medicare Other | Source: Ambulatory Visit | Attending: Specialist | Admitting: Specialist

## 2014-10-18 DIAGNOSIS — M47812 Spondylosis without myelopathy or radiculopathy, cervical region: Secondary | ICD-10-CM

## 2014-10-18 DIAGNOSIS — M542 Cervicalgia: Secondary | ICD-10-CM | POA: Diagnosis not present

## 2014-10-18 MED ORDER — TRIAMCINOLONE ACETONIDE 40 MG/ML IJ SUSP (RADIOLOGY)
60.0000 mg | Freq: Once | INTRAMUSCULAR | Status: AC
  Administered 2014-10-18: 60 mg via EPIDURAL

## 2014-10-18 MED ORDER — IOHEXOL 300 MG/ML  SOLN
1.0000 mL | Freq: Once | INTRAMUSCULAR | Status: AC | PRN
  Administered 2014-10-18: 1 mL via EPIDURAL

## 2014-12-04 DIAGNOSIS — M5136 Other intervertebral disc degeneration, lumbar region: Secondary | ICD-10-CM | POA: Diagnosis not present

## 2014-12-04 DIAGNOSIS — M4722 Other spondylosis with radiculopathy, cervical region: Secondary | ICD-10-CM | POA: Diagnosis not present

## 2014-12-21 ENCOUNTER — Other Ambulatory Visit: Payer: Self-pay | Admitting: Specialist

## 2014-12-21 DIAGNOSIS — M542 Cervicalgia: Secondary | ICD-10-CM

## 2014-12-27 ENCOUNTER — Ambulatory Visit
Admission: RE | Admit: 2014-12-27 | Discharge: 2014-12-27 | Disposition: A | Discharge status: Home / Self Care | Payer: Medicare Other | Source: Ambulatory Visit | Attending: Specialist | Admitting: Specialist

## 2014-12-27 DIAGNOSIS — M542 Cervicalgia: Secondary | ICD-10-CM | POA: Diagnosis not present

## 2014-12-27 MED ORDER — TRIAMCINOLONE ACETONIDE 40 MG/ML IJ SUSP (RADIOLOGY)
60.0000 mg | Freq: Once | INTRAMUSCULAR | Status: AC
  Administered 2014-12-27: 60 mg via EPIDURAL

## 2014-12-27 MED ORDER — IOHEXOL 300 MG/ML  SOLN
1.0000 mL | Freq: Once | INTRAMUSCULAR | Status: AC | PRN
  Administered 2014-12-27: 1 mL via EPIDURAL

## 2015-01-16 ENCOUNTER — Other Ambulatory Visit: Payer: Self-pay | Admitting: Specialist

## 2015-01-16 DIAGNOSIS — M542 Cervicalgia: Secondary | ICD-10-CM

## 2015-01-26 ENCOUNTER — Other Ambulatory Visit: Payer: Medicare Other

## 2015-01-29 ENCOUNTER — Ambulatory Visit
Admission: RE | Admit: 2015-01-29 | Discharge: 2015-01-29 | Disposition: A | Discharge status: Home / Self Care | Payer: Medicare Other | Source: Ambulatory Visit | Attending: Specialist | Admitting: Specialist

## 2015-01-29 DIAGNOSIS — M542 Cervicalgia: Secondary | ICD-10-CM

## 2015-01-29 MED ORDER — TRIAMCINOLONE ACETONIDE 40 MG/ML IJ SUSP (RADIOLOGY)
60.0000 mg | Freq: Once | INTRAMUSCULAR | Status: AC
  Administered 2015-01-29: 60 mg via EPIDURAL

## 2015-01-29 MED ORDER — IOHEXOL 300 MG/ML  SOLN
1.0000 mL | Freq: Once | INTRAMUSCULAR | Status: AC | PRN
  Administered 2015-01-29: 1 mL via EPIDURAL

## 2015-02-01 DIAGNOSIS — F332 Major depressive disorder, recurrent severe without psychotic features: Secondary | ICD-10-CM | POA: Diagnosis not present

## 2015-02-01 DIAGNOSIS — M5136 Other intervertebral disc degeneration, lumbar region: Secondary | ICD-10-CM | POA: Diagnosis not present

## 2015-02-01 DIAGNOSIS — M4722 Other spondylosis with radiculopathy, cervical region: Secondary | ICD-10-CM | POA: Diagnosis not present

## 2015-02-02 ENCOUNTER — Other Ambulatory Visit: Payer: Medicare Other

## 2015-05-15 DIAGNOSIS — M25561 Pain in right knee: Secondary | ICD-10-CM | POA: Diagnosis not present

## 2015-06-28 ENCOUNTER — Encounter: Payer: Self-pay | Admitting: Internal Medicine

## 2015-07-13 ENCOUNTER — Telehealth: Payer: Self-pay | Admitting: Internal Medicine

## 2015-07-13 MED ORDER — FLUTICASONE-SALMETEROL 250-50 MCG/DOSE IN AEPB: INHALATION_SPRAY | RESPIRATORY_TRACT | Status: DC

## 2015-07-13 NOTE — Telephone Encounter (Signed)
Spoke with pt. She needs a refill on Advair. Has upcoming appointment with CY in March 2017. Rx has been sent in. Nothing further was needed.

## 2015-07-13 NOTE — Telephone Encounter (Signed)
Pt returning call and can be reached @ same.Natalie Berg ° °

## 2015-07-13 NOTE — Telephone Encounter (Signed)
lmtcb x1 for pt. 

## 2015-10-12 ENCOUNTER — Ambulatory Visit: Payer: Medicare Other | Admitting: Internal Medicine

## 2016-01-03 ENCOUNTER — Ambulatory Visit: Payer: Medicare Other | Admitting: Internal Medicine

## 2016-01-17 ENCOUNTER — Encounter: Payer: Self-pay | Admitting: Internal Medicine

## 2016-01-17 ENCOUNTER — Ambulatory Visit (INDEPENDENT_AMBULATORY_CARE_PROVIDER_SITE_OTHER): Payer: Medicare Other | Admitting: Internal Medicine

## 2016-01-17 VITALS — BP 110/78 | HR 92 | Ht 68.0 in | Wt 163.4 lb

## 2016-01-17 DIAGNOSIS — J45998 Other asthma: Secondary | ICD-10-CM

## 2016-01-17 DIAGNOSIS — J309 Allergic rhinitis, unspecified: Secondary | ICD-10-CM

## 2016-01-17 DIAGNOSIS — J3089 Other allergic rhinitis: Principal | ICD-10-CM

## 2016-01-17 DIAGNOSIS — J302 Other seasonal allergic rhinitis: Secondary | ICD-10-CM

## 2016-01-17 MED ORDER — ALBUTEROL SULFATE HFA 108 (90 BASE) MCG/ACT IN AERS: INHALATION_SPRAY | RESPIRATORY_TRACT | Status: DC

## 2016-01-17 MED ORDER — FLUTICASONE-SALMETEROL 500-50 MCG/DOSE IN AEPB: INHALATION_SPRAY | RESPIRATORY_TRACT | Status: DC

## 2016-01-17 MED ORDER — EPINEPHRINE 0.3 MG/0.3ML IJ SOAJ: 0.3000 mg | Freq: Once | INTRAMUSCULAR | Status: DC

## 2016-01-17 NOTE — Patient Instructions (Addendum)
Script sent for Advair 500 to replace Advair 250 for now  Scripts sent for albuterol HFA  Please call as needed  Sample Dymista nasal spray    1-2 puffs each nostril once daily at bedtime
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
## 2016-01-20 NOTE — Assessment & Plan Note (Signed)
Some increased chest tightness and wheeze since she came off of prednisone therapy used for her back. Plan-increase Advair to 500/50 as discussed

## 2016-01-20 NOTE — Assessment & Plan Note (Signed)
Increased rhinitis now after peak spring pollen season is over. She hasn't been using Flonase and Claritin long enough yet to be sure of effect.

## 2016-01-29 ENCOUNTER — Other Ambulatory Visit: Payer: Self-pay | Admitting: *Deleted

## 2016-01-29 MED ORDER — FLUTICASONE-SALMETEROL 250-50 MCG/DOSE IN AEPB: INHALATION_SPRAY | RESPIRATORY_TRACT | Status: DC

## 2016-03-07 ENCOUNTER — Other Ambulatory Visit: Payer: Self-pay | Admitting: Specialist

## 2016-03-07 DIAGNOSIS — M47812 Spondylosis without myelopathy or radiculopathy, cervical region: Secondary | ICD-10-CM

## 2016-03-07 DIAGNOSIS — M5136 Other intervertebral disc degeneration, lumbar region: Secondary | ICD-10-CM

## 2016-03-07 DIAGNOSIS — M48061 Spinal stenosis, lumbar region without neurogenic claudication: Secondary | ICD-10-CM

## 2016-03-13 ENCOUNTER — Ambulatory Visit
Admission: RE | Admit: 2016-03-13 | Discharge: 2016-03-13 | Disposition: A | Discharge status: Home / Self Care | Payer: Medicare Other | Source: Ambulatory Visit | Attending: Specialist | Admitting: Specialist

## 2016-03-13 VITALS — BP 133/61 | HR 90

## 2016-03-13 DIAGNOSIS — M48061 Spinal stenosis, lumbar region without neurogenic claudication: Secondary | ICD-10-CM

## 2016-03-13 DIAGNOSIS — M5136 Other intervertebral disc degeneration, lumbar region: Secondary | ICD-10-CM

## 2016-03-13 MED ORDER — METHYLPREDNISOLONE ACETATE 40 MG/ML INJ SUSP (RADIOLOG
120.0000 mg | Freq: Once | INTRAMUSCULAR | Status: AC
  Administered 2016-03-13: 120 mg via EPIDURAL

## 2016-03-13 MED ORDER — IOPAMIDOL (ISOVUE-M 200) INJECTION 41%
1.0000 mL | Freq: Once | INTRAMUSCULAR | Status: AC
  Administered 2016-03-13: 1 mL via EPIDURAL

## 2016-03-17 ENCOUNTER — Ambulatory Visit (INDEPENDENT_AMBULATORY_CARE_PROVIDER_SITE_OTHER): Payer: Medicare Other | Admitting: Neurology

## 2016-03-17 ENCOUNTER — Ambulatory Visit (INDEPENDENT_AMBULATORY_CARE_PROVIDER_SITE_OTHER): Payer: Self-pay | Admitting: Neurology

## 2016-03-17 DIAGNOSIS — M5010 Cervical disc disorder with radiculopathy, unspecified cervical region: Secondary | ICD-10-CM

## 2016-03-17 DIAGNOSIS — Z0289 Encounter for other administrative examinations: Secondary | ICD-10-CM

## 2016-03-17 DIAGNOSIS — M50123 Cervical disc disorder at C6-C7 level with radiculopathy: Secondary | ICD-10-CM

## 2016-03-17 NOTE — Progress Notes (Signed)
See procedure note.

## 2016-03-17 NOTE — Progress Notes (Signed)
  [facility]    Provider:  Dr Lucia GaskinsAhern Referring Provider: Dr. Otelia SergeantNitka and Zonia KiefJames Owens [facility]  HPI:  Alan Mulderonya D Pippenger is a 45 y.o. female here as a referral for evaluation of cervical radiculopathy or peripheral neuropathy. Patient has neck pain and upper extremity radiculopathy. She's had multiple epidural steroid injections. She's had previous C3-C6 ACDF.  MRI cervical spine 09/2014: C6-7: Mildly progressive disc space height loss. Broad-based posterior disc osteophyte complex asymmetric to the left results in borderline spinal stenosis and new, mild to moderate left neural foraminal stenosis.  C7-T1:  Negative.  IMPRESSION: 1. Interval C3-4 ACDF without residual stenosis. 2. Mildly progressive disc degeneration at C6-7 with mild to moderate left neural foraminal stenosis. 3. No significant right neural foraminal stenosis or spinal stenosis identified.  Summary  Nerve conduction studies were performed on the bilateral upper extremities:  The bilateral Median motor nerves showed normal conductions with normal F Wave latency The bilateral Ulnar motor nerves showed normal conductions with normal F Wave latency The bilateral second-digit Median sensory nerves were within normal limits The bilateral fifth-digit Ulnar sensory nerves were within normal limits The bilateral Radial sensory nerves were within normal limits  EMG Needle study was performed on selected bilateral upper extremity muscles:   The Deltoid, Triceps, Biceps, Flexor Digitorum Profundus, Extensor Indicis, Extensor Digitorum Communis,  Pronator Teres, Opponens Pollicis, First Dorsal interosseous and lower cervical paraspinals were normal bilaterally.    Conclusion: This is a normal study. No electrophysiologic evidence for ulnar or median neuropathy, peripheral polyneuropathy, cervical radiculopathy, muscle disorder.  Naomie DeanAntonia Ahern, MD  [facility] [address] [place], [place] [id number]  Phone [phone] Fax [phone]

## 2016-03-27 ENCOUNTER — Ambulatory Visit
Admission: RE | Admit: 2016-03-27 | Discharge: 2016-03-27 | Disposition: A | Discharge status: Home / Self Care | Payer: Medicare Other | Source: Ambulatory Visit | Attending: Specialist | Admitting: Specialist

## 2016-03-27 ENCOUNTER — Other Ambulatory Visit: Payer: Medicare Other

## 2016-03-27 DIAGNOSIS — M47812 Spondylosis without myelopathy or radiculopathy, cervical region: Secondary | ICD-10-CM

## 2016-03-27 MED ORDER — TRIAMCINOLONE ACETONIDE 40 MG/ML IJ SUSP (RADIOLOGY)
60.0000 mg | Freq: Once | INTRAMUSCULAR | Status: AC
  Administered 2016-03-27: 60 mg via EPIDURAL

## 2016-03-27 MED ORDER — IOPAMIDOL (ISOVUE-M 300) INJECTION 61%
1.0000 mL | Freq: Once | INTRAMUSCULAR | Status: AC | PRN
  Administered 2016-03-27: 1 mL via EPIDURAL

## 2016-03-29 ENCOUNTER — Other Ambulatory Visit: Payer: Self-pay | Admitting: Specialist

## 2016-03-29 DIAGNOSIS — M545 Low back pain: Secondary | ICD-10-CM

## 2016-04-08 ENCOUNTER — Inpatient Hospital Stay: Admission: RE | Admit: 2016-04-08 | Payer: Medicare Other | Source: Ambulatory Visit

## 2016-05-11 ENCOUNTER — Ambulatory Visit
Admission: RE | Admit: 2016-05-11 | Discharge: 2016-05-11 | Disposition: A | Discharge status: Home / Self Care | Payer: Medicare Other | Source: Ambulatory Visit | Attending: Specialist | Admitting: Specialist

## 2016-05-11 DIAGNOSIS — M545 Low back pain, unspecified: Secondary | ICD-10-CM

## 2016-05-11 MED ORDER — GADOBENATE DIMEGLUMINE 529 MG/ML IV SOLN
14.0000 mL | Freq: Once | INTRAVENOUS | Status: AC | PRN
  Administered 2016-05-11: 14 mL via INTRAVENOUS

## 2016-05-15 ENCOUNTER — Ambulatory Visit (INDEPENDENT_AMBULATORY_CARE_PROVIDER_SITE_OTHER): Payer: Medicare Other | Admitting: Specialist

## 2016-05-15 ENCOUNTER — Encounter (INDEPENDENT_AMBULATORY_CARE_PROVIDER_SITE_OTHER): Payer: Self-pay | Admitting: Specialist

## 2016-05-15 VITALS — BP 90/60 | HR 68 | Ht 67.0 in | Wt 162.0 lb

## 2016-05-15 DIAGNOSIS — G8929 Other chronic pain: Secondary | ICD-10-CM | POA: Diagnosis not present

## 2016-05-15 DIAGNOSIS — M5442 Lumbago with sciatica, left side: Secondary | ICD-10-CM

## 2016-05-15 DIAGNOSIS — M5136 Other intervertebral disc degeneration, lumbar region: Secondary | ICD-10-CM | POA: Diagnosis not present

## 2016-05-15 NOTE — Patient Instructions (Signed)
Avoiding bending stooping and lifting greater than 10 lbs. No prolong driving. Weight loss of 10 lbs. Floor exercises, particularly extension exercises of the lumbar spine. Arthritis medication like tylenol and alleve or advil or motrin are okay for disc pain. Will make appointment to see Dr. Lajoyce Cornersuda for ongoing left foot pain ? Intrinsic.
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
## 2016-05-22 ENCOUNTER — Other Ambulatory Visit (INDEPENDENT_AMBULATORY_CARE_PROVIDER_SITE_OTHER): Payer: Self-pay | Admitting: Specialist

## 2016-05-22 DIAGNOSIS — M5442 Lumbago with sciatica, left side: Principal | ICD-10-CM

## 2016-05-22 DIAGNOSIS — G8929 Other chronic pain: Secondary | ICD-10-CM

## 2016-05-23 ENCOUNTER — Other Ambulatory Visit (INDEPENDENT_AMBULATORY_CARE_PROVIDER_SITE_OTHER): Payer: Self-pay | Admitting: Specialist

## 2016-05-23 DIAGNOSIS — G8929 Other chronic pain: Secondary | ICD-10-CM

## 2016-05-23 DIAGNOSIS — M5442 Lumbago with sciatica, left side: Principal | ICD-10-CM

## 2016-05-26 ENCOUNTER — Telehealth (INDEPENDENT_AMBULATORY_CARE_PROVIDER_SITE_OTHER): Payer: Self-pay | Admitting: *Deleted

## 2016-05-26 DIAGNOSIS — M48062 Spinal stenosis, lumbar region with neurogenic claudication: Secondary | ICD-10-CM

## 2016-05-26 NOTE — Telephone Encounter (Signed)
I called and got an answering machine and left her a message to let us know whether she preferred a cervical ESI or if she needs us to considering ordering a lumbar ESI.

## 2016-05-26 NOTE — Telephone Encounter (Signed)
Noted,

## 2016-05-26 NOTE — Telephone Encounter (Signed)
Ok to order injection?

## 2016-05-26 NOTE — Telephone Encounter (Signed)
PT. [facility]. Patient call back number is [phone].

## 2016-05-27 NOTE — Telephone Encounter (Signed)
Patient called back stating that it was the lumbar injection.  Contact Info: [phone]

## 2016-05-28 ENCOUNTER — Telehealth (INDEPENDENT_AMBULATORY_CARE_PROVIDER_SITE_OTHER): Payer: Self-pay | Admitting: *Deleted

## 2016-05-28 NOTE — Telephone Encounter (Signed)
I called pt to get scheduled for follow up CT with JN, no answer and vm was full. Will try again later.

## 2016-05-28 NOTE — Telephone Encounter (Signed)
Pt called back and follow up appt has been made for Dec 21 at 130

## 2016-05-28 NOTE — Telephone Encounter (Signed)
Noted,  Thanks. Natalie Berg

## 2016-05-28 NOTE — Telephone Encounter (Signed)
See below patient was returning your call states it is for lumbar region. Ok to place order?

## 2016-05-28 NOTE — Telephone Encounter (Signed)
Lumbar epidural steroids ordered placed left interlaminar L34 level above previous L4 sacrum fusion. Patient prefers Dr. Galen ManilaMark Shogerty.

## 2016-05-29 ENCOUNTER — Telehealth (INDEPENDENT_AMBULATORY_CARE_PROVIDER_SITE_OTHER): Payer: Self-pay | Admitting: *Deleted

## 2016-05-29 NOTE — Telephone Encounter (Signed)
Pt called left vm stating she would like to have a ESI done and wants to have it scheduled around the same time as her appt for the discogram which is on nov 14, [facility] and was not sure if you want her to have one until her results for the discogram comes in. Please advise  Thanks

## 2016-05-29 NOTE — Telephone Encounter (Signed)
The order has been placed by Dr. Otelia SergeantNitka. They will call her and get her scheduled. Thanks.

## 2016-05-30 ENCOUNTER — Other Ambulatory Visit (INDEPENDENT_AMBULATORY_CARE_PROVIDER_SITE_OTHER): Payer: Self-pay | Admitting: Specialist

## 2016-05-30 DIAGNOSIS — M48062 Spinal stenosis, lumbar region with neurogenic claudication: Secondary | ICD-10-CM

## 2016-05-30 NOTE — Telephone Encounter (Signed)
Called and left message on Roberta from [facility] informing her that ESI order was in

## 2016-06-02 NOTE — Discharge Instructions (Signed)
Discogram Post Procedure Discharge Instructions ° °1. May resume a regular diet and any medications that you routinely take (including pain medications). °2. No driving day of procedure. °3. Upon discharge go home and rest for at least 4 hours.  May use an ice pack as needed to injection sites on back.  Ice to back 30 minutes on and 30 minutes off, all day. °4. May remove bandades later, today. °5. It is not unusual to be sore for several days after this procedure. ° ° ° °Please contact our office at [phone] for the following symptoms: ° °· Fever greater than 100 degrees °· Increased swelling, pain, or redness at injection site. ° ° °Thank you for visiting [facility]. ° ° °

## 2016-06-03 ENCOUNTER — Ambulatory Visit
Admission: RE | Admit: 2016-06-03 | Discharge: 2016-06-03 | Disposition: A | Discharge status: Home / Self Care | Payer: Medicare Other | Source: Ambulatory Visit | Attending: Specialist | Admitting: Specialist

## 2016-06-03 ENCOUNTER — Other Ambulatory Visit (INDEPENDENT_AMBULATORY_CARE_PROVIDER_SITE_OTHER): Payer: Self-pay | Admitting: Specialist

## 2016-06-03 VITALS — BP 137/62 | HR 79 | Resp 17

## 2016-06-03 DIAGNOSIS — G8929 Other chronic pain: Secondary | ICD-10-CM

## 2016-06-03 DIAGNOSIS — M5442 Lumbago with sciatica, left side: Principal | ICD-10-CM

## 2016-06-03 MED ORDER — SODIUM CHLORIDE 0.9 % IV SOLN
Freq: Once | INTRAVENOUS | Status: AC
  Administered 2016-06-03: 13:00:00 via INTRAVENOUS

## 2016-06-03 MED ORDER — MIDAZOLAM HCL 2 MG/2ML IJ SOLN
1.0000 mg | INTRAMUSCULAR | Status: DC | PRN
  Administered 2016-06-03: 0.5 mg via INTRAVENOUS

## 2016-06-03 MED ORDER — KETOROLAC TROMETHAMINE 30 MG/ML IJ SOLN
30.0000 mg | Freq: Once | INTRAMUSCULAR | Status: AC
  Administered 2016-06-03: 30 mg via INTRAVENOUS

## 2016-06-03 MED ORDER — IOPAMIDOL (ISOVUE-M 200) INJECTION 41%
3.5000 mL | Freq: Once | INTRAMUSCULAR | Status: AC
  Administered 2016-06-03: 3.5 mL

## 2016-06-03 MED ORDER — FENTANYL CITRATE (PF) 100 MCG/2ML IJ SOLN
25.0000 ug | INTRAMUSCULAR | Status: DC | PRN
  Administered 2016-06-03: 50 ug via INTRAVENOUS

## 2016-06-03 MED ORDER — CEFAZOLIN SODIUM-DEXTROSE 2-4 GM/100ML-% IV SOLN
2.0000 g | Freq: Once | INTRAVENOUS | Status: AC
  Administered 2016-06-03: 2 g via INTRAVENOUS

## 2016-06-11 ENCOUNTER — Other Ambulatory Visit: Payer: Self-pay

## 2016-06-11 ENCOUNTER — Telehealth: Payer: Self-pay | Admitting: Internal Medicine

## 2016-06-11 MED ORDER — FLUTICASONE-SALMETEROL 500-50 MCG/DOSE IN AEPB: 1.0000 | INHALATION_SPRAY | Freq: Two times a day (BID) | RESPIRATORY_TRACT | 5 refills | Status: DC

## 2016-06-11 NOTE — Telephone Encounter (Signed)
Received refill request from [facility]. Rx has been sent in. Nothing further needed.

## 2016-06-11 NOTE — Telephone Encounter (Signed)
Rx has been sent to preferred pharmacy. Pt mother (dpr) is made aware & states she will inform pt. Nothing further needed.

## 2016-06-20 ENCOUNTER — Ambulatory Visit
Admission: RE | Admit: 2016-06-20 | Discharge: 2016-06-20 | Disposition: A | Discharge status: Home / Self Care | Payer: Medicare Other | Source: Ambulatory Visit | Attending: Specialist | Admitting: Specialist

## 2016-06-20 DIAGNOSIS — M48062 Spinal stenosis, lumbar region with neurogenic claudication: Secondary | ICD-10-CM

## 2016-06-20 MED ORDER — IOPAMIDOL (ISOVUE-M 200) INJECTION 41%
1.0000 mL | Freq: Once | INTRAMUSCULAR | Status: AC
  Administered 2016-06-20: 1 mL via EPIDURAL

## 2016-06-20 MED ORDER — METHYLPREDNISOLONE ACETATE 40 MG/ML INJ SUSP (RADIOLOG
120.0000 mg | Freq: Once | INTRAMUSCULAR | Status: AC
  Administered 2016-06-20: 120 mg via EPIDURAL

## 2016-06-20 NOTE — Discharge Instructions (Signed)

## 2016-07-10 ENCOUNTER — Ambulatory Visit (INDEPENDENT_AMBULATORY_CARE_PROVIDER_SITE_OTHER): Payer: Medicare Other | Admitting: Specialist

## 2016-07-10 ENCOUNTER — Encounter (INDEPENDENT_AMBULATORY_CARE_PROVIDER_SITE_OTHER): Payer: Self-pay | Admitting: Specialist

## 2016-07-10 VITALS — BP 134/83 | HR 75 | Ht 67.0 in | Wt 162.0 lb

## 2016-07-10 DIAGNOSIS — M5136 Other intervertebral disc degeneration, lumbar region: Secondary | ICD-10-CM

## 2016-07-10 DIAGNOSIS — R202 Paresthesia of skin: Secondary | ICD-10-CM | POA: Diagnosis not present

## 2016-07-10 DIAGNOSIS — R200 Anesthesia of skin: Secondary | ICD-10-CM

## 2016-07-10 DIAGNOSIS — M5416 Radiculopathy, lumbar region: Secondary | ICD-10-CM | POA: Diagnosis not present

## 2016-07-10 DIAGNOSIS — M5030 Other cervical disc degeneration, unspecified cervical region: Secondary | ICD-10-CM | POA: Diagnosis not present

## 2016-07-10 NOTE — Patient Instructions (Signed)
Avoid bending, stooping and avoid lifting weights greater than 10 lbs. Avoid prolong standing and walking. Avoid frequent bending and stooping  No lifting greater than 10 lbs. May use ice or moist heat for pain. Weight loss is of benefit. Handicap license is approved. Avoid overhead lifting and overhead use of the arms. Do not lift greater than 5 lbs. Adjust head rest in vehicle to prevent hyperextension if rear ended. Take extra precautions to avoid falling, including use of a cane if you feel weak. Scheduling secretary Tivis RingerSherri Billings. will call you to arrange for surgery for your cervical spine. If you wish a second opinion please let us know and we can arrange for you. If you have worsening arm or leg numbness or weakness please call or go to an ER.  .Marland Kitchen
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
## 2016-07-17 ENCOUNTER — Other Ambulatory Visit (INDEPENDENT_AMBULATORY_CARE_PROVIDER_SITE_OTHER): Payer: Self-pay | Admitting: Specialist

## 2016-07-17 DIAGNOSIS — M5136 Other intervertebral disc degeneration, lumbar region: Secondary | ICD-10-CM

## 2016-07-17 DIAGNOSIS — M5416 Radiculopathy, lumbar region: Secondary | ICD-10-CM

## 2016-08-13 ENCOUNTER — Ambulatory Visit
Admission: RE | Admit: 2016-08-13 | Discharge: 2016-08-13 | Disposition: A | Discharge status: Home / Self Care | Payer: Medicare Other | Source: Ambulatory Visit | Attending: Specialist | Admitting: Specialist

## 2016-08-13 VITALS — BP 131/76 | HR 83

## 2016-08-13 DIAGNOSIS — M5416 Radiculopathy, lumbar region: Secondary | ICD-10-CM

## 2016-08-13 DIAGNOSIS — M961 Postlaminectomy syndrome, not elsewhere classified: Secondary | ICD-10-CM

## 2016-08-13 DIAGNOSIS — M5136 Other intervertebral disc degeneration, lumbar region: Secondary | ICD-10-CM

## 2016-08-13 MED ORDER — IOPAMIDOL (ISOVUE-M 200) INJECTION 41%
1.0000 mL | Freq: Once | INTRAMUSCULAR | Status: AC
  Administered 2016-08-13: 1 mL via EPIDURAL

## 2016-08-13 MED ORDER — METHYLPREDNISOLONE ACETATE 40 MG/ML INJ SUSP (RADIOLOG
120.0000 mg | Freq: Once | INTRAMUSCULAR | Status: AC
  Administered 2016-08-13: 120 mg via EPIDURAL

## 2016-08-13 NOTE — Discharge Instructions (Signed)

## 2016-08-29 ENCOUNTER — Encounter (INDEPENDENT_AMBULATORY_CARE_PROVIDER_SITE_OTHER): Payer: Self-pay | Admitting: Specialist

## 2016-08-29 ENCOUNTER — Ambulatory Visit (INDEPENDENT_AMBULATORY_CARE_PROVIDER_SITE_OTHER): Payer: Medicare Other | Admitting: Specialist

## 2016-08-29 VITALS — BP 109/75 | HR 83 | Ht 67.0 in | Wt 162.0 lb

## 2016-08-29 DIAGNOSIS — M5136 Other intervertebral disc degeneration, lumbar region: Secondary | ICD-10-CM | POA: Diagnosis not present

## 2016-08-29 DIAGNOSIS — M4722 Other spondylosis with radiculopathy, cervical region: Secondary | ICD-10-CM
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
## 2016-08-29 NOTE — Patient Instructions (Addendum)
Avoid overhead lifting and overhead use of the arms. Do not lift greater than 5 lbs. Adjust head rest in vehicle to prevent hyperextension if rear ended. If you have worsening arm or leg numbness or weakness please call or go to an ER. Avoid bending, stooping and avoid lifting weights greater than 10 lbs. Avoid prolong standing and walking. Order for a new walker with wheels. Expect improved walking and standing tolerance. Expect relief of leg pain but numbness may persist depending on the length and degree of pressure that has been present.  ESI final 2 injectons of the left cervical spine C7-T1 are to be scheduled with Dr. Arnetha [facility]ulaShogerty at [facility].

## 2016-09-02 ENCOUNTER — Other Ambulatory Visit (INDEPENDENT_AMBULATORY_CARE_PROVIDER_SITE_OTHER): Payer: Self-pay | Admitting: Specialist

## 2016-09-02 DIAGNOSIS — M4722 Other spondylosis with radiculopathy, cervical region: Secondary | ICD-10-CM

## 2016-09-09 ENCOUNTER — Ambulatory Visit
Admission: RE | Admit: 2016-09-09 | Discharge: 2016-09-09 | Disposition: A | Discharge status: Home / Self Care | Payer: Medicare Other | Source: Ambulatory Visit | Attending: Specialist | Admitting: Specialist

## 2016-09-09 DIAGNOSIS — M4722 Other spondylosis with radiculopathy, cervical region: Secondary | ICD-10-CM

## 2016-09-09 MED ORDER — IOPAMIDOL (ISOVUE-M 300) INJECTION 61%
1.0000 mL | Freq: Once | INTRAMUSCULAR | Status: AC | PRN
Start: 2016-09-09 — End: 2016-09-09
  Administered 2016-09-09: 1 mL via EPIDURAL

## 2016-09-09 MED ORDER — TRIAMCINOLONE ACETONIDE 40 MG/ML IJ SUSP (RADIOLOGY)
60.0000 mg | Freq: Once | INTRAMUSCULAR | Status: AC
  Administered 2016-09-09: 60 mg via EPIDURAL

## 2016-10-09 ENCOUNTER — Telehealth (INDEPENDENT_AMBULATORY_CARE_PROVIDER_SITE_OTHER): Payer: Self-pay | Admitting: Specialist

## 2016-10-09 NOTE — Telephone Encounter (Signed)
Patient called stating that she is ready for her next injection which will be done in April, she just needs the order.  CB#[phone].  Thank you.

## 2016-10-10 NOTE — Telephone Encounter (Signed)
Patient called stating that she is ready for her next injection which will be done in April, she just needs the order

## 2016-10-28 ENCOUNTER — Telehealth (INDEPENDENT_AMBULATORY_CARE_PROVIDER_SITE_OTHER): Payer: Self-pay | Admitting: *Deleted

## 2016-10-28 NOTE — Telephone Encounter (Signed)
Patient called stating that she is ready for her next injection which is due now, she just needs the order put in so that Roberta at [facility] can schedule her.

## 2016-10-28 NOTE — Telephone Encounter (Signed)
Patient called stating that she is ready for her next injection which is due now, she just needs the order put in so that Roberta at [facility] can schedule her. Please advise!  Cb# [phone]

## 2016-10-29 ENCOUNTER — Other Ambulatory Visit (INDEPENDENT_AMBULATORY_CARE_PROVIDER_SITE_OTHER): Payer: Self-pay | Admitting: Specialist

## 2016-10-29 DIAGNOSIS — M47812 Spondylosis without myelopathy or radiculopathy, cervical region: Secondary | ICD-10-CM

## 2016-10-29 NOTE — Telephone Encounter (Signed)
I am seeking a second opinion about the number of cortisone shots we are prescribing. WIll refer to [facility] for eval .  I will order the steroid reluctantly and seek a second opinion. jen

## 2016-10-29 NOTE — Telephone Encounter (Signed)
I am requesting a second opinion regarding the number of cortisone shots she is requesting and we are ordering, this should be from [facility] with Dr. Wyline Mood. I am willing to reluctantly order, where does she hurt? Which area does she want injected, her cervical was done 08/2016 and lumbar 07/2016??

## 2016-10-29 NOTE — Telephone Encounter (Signed)
I called and advised patient and she understands this

## 2016-10-29 NOTE — Telephone Encounter (Signed)
I called and advised patient and she understands this 

## 2016-11-05 ENCOUNTER — Other Ambulatory Visit (INDEPENDENT_AMBULATORY_CARE_PROVIDER_SITE_OTHER): Payer: Self-pay | Admitting: Specialist

## 2016-11-05 DIAGNOSIS — M47812 Spondylosis without myelopathy or radiculopathy, cervical region: Secondary | ICD-10-CM

## 2016-11-13 ENCOUNTER — Ambulatory Visit
Admission: RE | Admit: 2016-11-13 | Discharge: 2016-11-13 | Disposition: A | Discharge status: Home / Self Care | Payer: Medicare Other | Source: Ambulatory Visit | Attending: Specialist | Admitting: Specialist

## 2016-11-13 VITALS — BP 137/67 | HR 88

## 2016-11-13 DIAGNOSIS — M961 Postlaminectomy syndrome, not elsewhere classified: Secondary | ICD-10-CM

## 2016-11-13 DIAGNOSIS — M47812 Spondylosis without myelopathy or radiculopathy, cervical region: Secondary | ICD-10-CM

## 2016-11-13 DIAGNOSIS — M5020 Other cervical disc displacement, unspecified cervical region: Secondary | ICD-10-CM

## 2016-11-13 MED ORDER — IOPAMIDOL (ISOVUE-M 300) INJECTION 61%
1.0000 mL | Freq: Once | INTRAMUSCULAR | Status: AC | PRN
Start: 2016-11-13 — End: 2016-11-13
  Administered 2016-11-13: 1 mL via EPIDURAL

## 2016-11-13 MED ORDER — TRIAMCINOLONE ACETONIDE 40 MG/ML IJ SUSP (RADIOLOGY)
60.0000 mg | Freq: Once | INTRAMUSCULAR | Status: AC
  Administered 2016-11-13: 60 mg via EPIDURAL

## 2016-11-26 ENCOUNTER — Ambulatory Visit (INDEPENDENT_AMBULATORY_CARE_PROVIDER_SITE_OTHER): Payer: Medicare Other | Admitting: Specialist

## 2017-01-14 ENCOUNTER — Ambulatory Visit (INDEPENDENT_AMBULATORY_CARE_PROVIDER_SITE_OTHER): Payer: Medicare Other | Admitting: Specialist

## 2017-02-04 ENCOUNTER — Encounter (INDEPENDENT_AMBULATORY_CARE_PROVIDER_SITE_OTHER): Payer: Self-pay | Admitting: Specialist

## 2017-02-04 ENCOUNTER — Ambulatory Visit (INDEPENDENT_AMBULATORY_CARE_PROVIDER_SITE_OTHER): Payer: Medicare Other

## 2017-02-04 ENCOUNTER — Ambulatory Visit (INDEPENDENT_AMBULATORY_CARE_PROVIDER_SITE_OTHER): Payer: Medicare Other | Admitting: Specialist

## 2017-02-04 VITALS — BP 135/65 | HR 104 | Ht 67.0 in | Wt 160.0 lb

## 2017-02-04 DIAGNOSIS — M48062 Spinal stenosis, lumbar region with neurogenic claudication: Secondary | ICD-10-CM | POA: Diagnosis not present

## 2017-02-04 DIAGNOSIS — S93431A Sprain of tibiofibular ligament of right ankle, initial encounter: Secondary | ICD-10-CM

## 2017-02-04 DIAGNOSIS — M25512 Pain in left shoulder: Secondary | ICD-10-CM

## 2017-02-04 DIAGNOSIS — M19012 Primary osteoarthritis, left shoulder: Secondary | ICD-10-CM | POA: Diagnosis not present

## 2017-02-04 DIAGNOSIS — M5030 Other cervical disc degeneration, unspecified cervical region: Secondary | ICD-10-CM

## 2017-02-04 DIAGNOSIS — S139XXA Sprain of joints and ligaments of unspecified parts of neck, initial encounter: Secondary | ICD-10-CM

## 2017-02-04 DIAGNOSIS — S46912A Strain of unspecified muscle, fascia and tendon at shoulder and upper arm level, left arm, initial encounter: Secondary | ICD-10-CM

## 2017-02-04 MED ORDER — METHYLPREDNISOLONE ACETATE 40 MG/ML IJ SUSP
40.0000 mg | INTRAMUSCULAR | Status: AC | PRN
  Administered 2017-02-04: 40 mg via INTRA_ARTICULAR

## 2017-02-04 MED ORDER — BUPIVACAINE HCL 0.25 % IJ SOLN
4.0000 mL | INTRAMUSCULAR | Status: AC | PRN
  Administered 2017-02-04: 4 mL via INTRA_ARTICULAR
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
## 2017-02-04 NOTE — Patient Instructions (Signed)
Avoid overhead lifting and overhead use of the arms. Do not lift greater than 5 lbs. Adjust head rest in vehicle to prevent hyperextension if rear ended. Use the left ankle supprot for 2 weeks, start strenghtening exercise when it is comfortable to due A Epidural injection has been requested of Dr. Arnetha GulaShogerty  And his office should contact you to arrange. If the ankle pain and shoulder pain does not improve in 2-3 weeks please call us and we would be happy to see you sooner.  Naprosyn or motrin for discomfort in the form of alleve or motrin or ibuprofen.

## 2017-02-09 ENCOUNTER — Other Ambulatory Visit (INDEPENDENT_AMBULATORY_CARE_PROVIDER_SITE_OTHER): Payer: Self-pay | Admitting: Specialist

## 2017-02-09 DIAGNOSIS — M5030 Other cervical disc degeneration, unspecified cervical region: Secondary | ICD-10-CM

## 2017-02-09 DIAGNOSIS — M48062 Spinal stenosis, lumbar region with neurogenic claudication: Secondary | ICD-10-CM

## 2017-02-11 ENCOUNTER — Telehealth (INDEPENDENT_AMBULATORY_CARE_PROVIDER_SITE_OTHER): Payer: Self-pay | Admitting: Specialist

## 2017-02-11 NOTE — Telephone Encounter (Signed)
Pt called states [phone] To approve TENS unit for pt. Also pt states Otelia Sergeantitka was to refer to a Neurologist not a Midwifeneurosurgeon. She is awaiting that appt as well. Please refer pt to neurologist.

## 2017-02-13 NOTE — Telephone Encounter (Signed)
pt states Natalie Berg was to refer to a Neurologist not a Midwifeneurosurgeon. She is awaiting that appt as well. Please refer pt to neurologist-----Please advise no referral for Neurologist in the chart.

## 2017-02-16 ENCOUNTER — Other Ambulatory Visit (INDEPENDENT_AMBULATORY_CARE_PROVIDER_SITE_OTHER): Payer: Self-pay | Admitting: Specialist

## 2017-02-16 DIAGNOSIS — M5136 Other intervertebral disc degeneration, lumbar region: Secondary | ICD-10-CM

## 2017-02-16 DIAGNOSIS — M47812 Spondylosis without myelopathy or radiculopathy, cervical region: Secondary | ICD-10-CM

## 2017-02-16 NOTE — Telephone Encounter (Signed)
I recommended referring her to AGCO CorporationDell Curling, [place], especially concerned about too many ESIs, cervical and lumbar as her primary way of decreasing her pain.

## 2017-02-18 ENCOUNTER — Other Ambulatory Visit (INDEPENDENT_AMBULATORY_CARE_PROVIDER_SITE_OTHER): Payer: Self-pay | Admitting: Specialist

## 2017-02-18 ENCOUNTER — Ambulatory Visit
Admission: RE | Admit: 2017-02-18 | Discharge: 2017-02-18 | Disposition: A | Discharge status: Home / Self Care | Payer: Medicare Other | Source: Ambulatory Visit | Attending: Specialist | Admitting: Specialist

## 2017-02-18 DIAGNOSIS — M5030 Other cervical disc degeneration, unspecified cervical region: Secondary | ICD-10-CM

## 2017-02-18 DIAGNOSIS — M5442 Lumbago with sciatica, left side: Secondary | ICD-10-CM

## 2017-02-18 MED ORDER — TRIAMCINOLONE ACETONIDE 40 MG/ML IJ SUSP (RADIOLOGY)
60.0000 mg | Freq: Once | INTRAMUSCULAR | Status: AC
  Administered 2017-02-18: 60 mg via EPIDURAL

## 2017-02-18 MED ORDER — IOPAMIDOL (ISOVUE-M 300) INJECTION 61%
1.0000 mL | Freq: Once | INTRAMUSCULAR | Status: AC | PRN
Start: 2017-02-18 — End: 2017-02-18
  Administered 2017-02-18: 1 mL via EPIDURAL

## 2017-02-18 NOTE — Telephone Encounter (Signed)
Does she need the TENS UNIT?

## 2017-02-18 NOTE — Telephone Encounter (Signed)
I spoke to Mrs. Natalie Berg and we agreed that an evaluation by Dr. Lorelle Gibbsurling is the next step, he is a non surgical neurosurgeon who provides excellent evaluations. The TENs unit is okay with me, it will need a preapproval according to Mrs. Natalie Berg, let me know what I can do to help.

## 2017-02-20 NOTE — Telephone Encounter (Signed)
I called insurance Company --NO PRECERT REQUIRED for TENS Unit-----Sent Huntley DecSara the request via [employer] to get this for the patient

## 2017-02-26 NOTE — Telephone Encounter (Signed)
Huntley DecSara has called and lmom for pt to call her back regarding this--We are waiting on patient to call her back.

## 2017-03-04 ENCOUNTER — Other Ambulatory Visit: Payer: Medicare Other

## 2017-03-04 ENCOUNTER — Ambulatory Visit
Admission: RE | Admit: 2017-03-04 | Discharge: 2017-03-04 | Disposition: A | Discharge status: Home / Self Care | Payer: Medicare Other | Source: Ambulatory Visit | Attending: Specialist | Admitting: Specialist

## 2017-03-04 DIAGNOSIS — M48062 Spinal stenosis, lumbar region with neurogenic claudication: Secondary | ICD-10-CM

## 2017-03-04 MED ORDER — METHYLPREDNISOLONE ACETATE 40 MG/ML INJ SUSP (RADIOLOG
120.0000 mg | Freq: Once | INTRAMUSCULAR | Status: AC
  Administered 2017-03-04: 120 mg via EPIDURAL

## 2017-03-04 MED ORDER — IOPAMIDOL (ISOVUE-M 200) INJECTION 41%
1.0000 mL | Freq: Once | INTRAMUSCULAR | Status: AC
  Administered 2017-03-04: 1 mL via EPIDURAL

## 2017-03-04 NOTE — Discharge Instructions (Signed)

## 2017-03-19 ENCOUNTER — Ambulatory Visit (INDEPENDENT_AMBULATORY_CARE_PROVIDER_SITE_OTHER): Payer: Medicare Other | Admitting: Specialist

## 2017-04-15 NOTE — Telephone Encounter (Signed)
Natalie Berg came by the office today--I asked her about this patient, she states that the patient wanted to wait on this for now due to the cost  ($60) at this time.  But that she would be back in contact with her when she was ready to get it

## 2017-05-07 ENCOUNTER — Ambulatory Visit (INDEPENDENT_AMBULATORY_CARE_PROVIDER_SITE_OTHER): Payer: Medicare Other | Admitting: Specialist

## 2017-05-07 ENCOUNTER — Encounter (INDEPENDENT_AMBULATORY_CARE_PROVIDER_SITE_OTHER): Payer: Self-pay | Admitting: Specialist

## 2017-05-07 VITALS — BP 140/84 | Ht 67.0 in | Wt 160.0 lb

## 2017-05-07 DIAGNOSIS — M7582 Other shoulder lesions, left shoulder: Secondary | ICD-10-CM

## 2017-05-07 DIAGNOSIS — G8929 Other chronic pain: Secondary | ICD-10-CM | POA: Diagnosis not present

## 2017-05-07 DIAGNOSIS — M5030 Other cervical disc degeneration, unspecified cervical region: Secondary | ICD-10-CM | POA: Diagnosis not present

## 2017-05-07 DIAGNOSIS — M7542 Impingement syndrome of left shoulder: Secondary | ICD-10-CM | POA: Diagnosis not present

## 2017-05-07 DIAGNOSIS — M25511 Pain in right shoulder: Secondary | ICD-10-CM | POA: Diagnosis not present

## 2017-05-07 DIAGNOSIS — M778 Other enthesopathies, not elsewhere classified: Secondary | ICD-10-CM

## 2017-05-07 MED ORDER — BUPIVACAINE HCL 0.25 % IJ SOLN
4.0000 mL | INTRAMUSCULAR | Status: AC | PRN
  Administered 2017-05-07: 4 mL via INTRA_ARTICULAR

## 2017-05-07 MED ORDER — CYCLOBENZAPRINE HCL 10 MG PO TABS: 10.0000 mg | ORAL_TABLET | Freq: Three times a day (TID) | ORAL | 1 refills | Status: DC | PRN

## 2017-05-07 MED ORDER — METHYLPREDNISOLONE ACETATE 40 MG/ML IJ SUSP
40.0000 mg | INTRAMUSCULAR | Status: AC | PRN
  Administered 2017-05-07: 40 mg via INTRA_ARTICULAR
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
## 2017-05-07 NOTE — Patient Instructions (Signed)
Plan: Avoid overhead lifting and overhead use of the arms. Do not lift greater than 5 lbs. Adjust head rest in vehicle to prevent hyperextension if rear ended. Take extra precautions to avoid falling. Avoid overhead lifting and overhead use of the arms. Do not lift greater than 10 lbs. Tylenol ES one every 6-8 hours for pain and inflamation.  Continue with home exercises for another 2-3 weeks, then a home exercise program. Fall Prevention and Home Safety Falls cause injuries and can affect all age groups. It is possible to use preventive measures to significantly decrease the likelihood of falls. There are many simple measures which can make your home safer and prevent falls. OUTDOORS  Repair cracks and edges of walkways and driveways.  Remove high doorway thresholds.  Trim shrubbery on the main path into your home.  Have good outside lighting.  Clear walkways of tools, rocks, debris, and clutter.  Check that handrails are not broken and are securely fastened. Both sides of steps should have handrails.  Have leaves, snow, and ice cleared regularly.  Use sand or salt on walkways during winter months.  In the garage, clean up grease or oil spills. BATHROOM  Install night lights.  Install grab bars by the toilet and in the tub and shower.  Use non-skid mats or decals in the tub or shower.  Place a plastic non-slip stool in the shower to sit on, if needed.  Keep floors dry and clean up all water on the floor immediately.  Remove soap buildup in the tub or shower on a regular basis.  Secure bath mats with non-slip, double-sided rug tape.  Remove throw rugs and tripping hazards from the floors. BEDROOMS  Install night lights.  Make sure a bedside light is easy to reach.  Do not use oversized bedding.  Keep a telephone by your bedside.  Have a firm chair with side arms to use for getting dressed.  Remove throw rugs and tripping hazards from the  floor. KITCHEN  Keep handles on pots and pans turned toward the center of the stove. Use back burners when possible.  Clean up spills quickly and allow time for drying.  Avoid walking on wet floors.  Avoid hot utensils and knives.  Position shelves so they are not too high or low.  Place commonly used objects within easy reach.  If necessary, use a sturdy step stool with a grab bar when reaching.  Keep electrical cables out of the way.  Do not use floor polish or wax that makes floors slippery. If you must use wax, use non-skid floor wax.  Remove throw rugs and tripping hazards from the floor. STAIRWAYS  Never leave objects on stairs.  Place handrails on both sides of stairways and use them. Fix any loose handrails. Make sure handrails on both sides of the stairways are as long as the stairs.  Check carpeting to make sure it is firmly attached along stairs. Make repairs to worn or loose carpet promptly.  Avoid placing throw rugs at the top or bottom of stairways, or properly secure the rug with carpet tape to prevent slippage. Get rid of throw rugs, if possible.  Have an electrician put in a light switch at the top and bottom of the stairs. OTHER FALL PREVENTION TIPS  Wear low-heel or rubber-soled shoes that are supportive and fit well. Wear closed toe shoes.  When using a stepladder, make sure it is fully opened and both spreaders are firmly locked. Do not climb a  closed stepladder.  Add color or contrast paint or tape to grab bars and handrails in your home. Place contrasting color strips on first and last steps.  Learn and use mobility aids as needed. Install an electrical emergency response system.  Turn on lights to avoid dark areas. Replace light bulbs that burn out immediately. Get light switches that glow.  Arrange furniture to create clear pathways. Keep furniture in the same place.  Firmly attach carpet with non-skid or double-sided tape.  Eliminate uneven  floor surfaces.  Select a carpet pattern that does not visually hide the edge of steps.  Be aware of all pets. OTHER HOME SAFETY TIPS  Set the water temperature for 120 F (48.8 C).  Keep emergency numbers on or near the telephone.  Keep smoke detectors on every level of the home and near sleeping areas. Document Released: 06/27/2002 Document Revised: 01/06/2012 Document Reviewed: 09/26/2011 [facility] Patient Information 2014 [place].

## 2017-06-18 ENCOUNTER — Encounter (INDEPENDENT_AMBULATORY_CARE_PROVIDER_SITE_OTHER): Payer: Self-pay | Admitting: Specialist

## 2017-06-18 ENCOUNTER — Ambulatory Visit (INDEPENDENT_AMBULATORY_CARE_PROVIDER_SITE_OTHER): Payer: Medicare Other | Admitting: Specialist

## 2017-06-18 VITALS — BP 123/82 | Ht 67.0 in | Wt 160.0 lb

## 2017-06-18 DIAGNOSIS — M7542 Impingement syndrome of left shoulder: Secondary | ICD-10-CM

## 2017-06-18 DIAGNOSIS — G8929 Other chronic pain: Secondary | ICD-10-CM | POA: Insufficient documentation

## 2017-06-18 DIAGNOSIS — M810 Age-related osteoporosis without current pathological fracture: Secondary | ICD-10-CM | POA: Diagnosis not present

## 2017-06-18 DIAGNOSIS — M25512 Pain in left shoulder: Secondary | ICD-10-CM | POA: Diagnosis not present
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
## 2017-06-18 NOTE — Patient Instructions (Addendum)
Avoid overhead lifting and overhead use of the arms. Do not lift greater than 10 lbs. Tylenol ES one every 6-8 hours for pain and inflamation. Call if you are having right knee pain and need to consider a cortisone injection into the right knee. Continue with home exercise program. MRI scan or the left shoulder is ordered and a follow up appointment with Dr. Lajoyce Cornersuda. Bone Density test ordered, last in 2014 with osteoporosis T score -3.1  . Continue to take Vitamin D supplements 2000 IU per day and calcium up to 1500 mg per day in dietary and supplements combined.

## 2017-07-08 ENCOUNTER — Ambulatory Visit
Admission: RE | Admit: 2017-07-08 | Discharge: 2017-07-08 | Disposition: A | Discharge status: Home / Self Care | Payer: Medicare Other | Source: Ambulatory Visit | Attending: Specialist | Admitting: Specialist

## 2017-07-09 ENCOUNTER — Ambulatory Visit
Admission: RE | Admit: 2017-07-09 | Discharge: 2017-07-09 | Disposition: A | Discharge status: Home / Self Care | Payer: Medicare Other | Source: Ambulatory Visit | Attending: Specialist | Admitting: Specialist

## 2017-07-09 DIAGNOSIS — M25512 Pain in left shoulder: Principal | ICD-10-CM

## 2017-07-09 DIAGNOSIS — G8929 Other chronic pain: Secondary | ICD-10-CM

## 2017-07-10 ENCOUNTER — Other Ambulatory Visit (INDEPENDENT_AMBULATORY_CARE_PROVIDER_SITE_OTHER): Payer: Self-pay | Admitting: Specialist

## 2017-07-10 DIAGNOSIS — M810 Age-related osteoporosis without current pathological fracture: Secondary | ICD-10-CM

## 2017-07-16 ENCOUNTER — Telehealth (INDEPENDENT_AMBULATORY_CARE_PROVIDER_SITE_OTHER): Payer: Self-pay | Admitting: Orthopedic Surgery

## 2017-07-16 NOTE — Telephone Encounter (Signed)
Pt wanted to know the results for her Bone scan and MRI

## 2017-07-17 ENCOUNTER — Other Ambulatory Visit (INDEPENDENT_AMBULATORY_CARE_PROVIDER_SITE_OTHER): Payer: Self-pay | Admitting: Specialist

## 2017-07-17 DIAGNOSIS — M818 Other osteoporosis without current pathological fracture: Secondary | ICD-10-CM

## 2017-07-17 NOTE — Telephone Encounter (Signed)
Referral to Dr. Corliss Skainseveshwar for osteoporosis, Dr. Lajoyce Cornersuda for left shoulder impingement, he did right shoulder scope in the past with good success, type 2 acromion, DJD A-C joint minimal OA left G-H joint. 2 injections, home exercises and not improving has mild C6-7 DDD below C3-6 fusion.

## 2017-07-24 NOTE — Telephone Encounter (Signed)
I called and advised that she needs to see both Dr. Corliss Skainseveshwar and Dr. Lajoyce Cornersuda.

## 2017-08-06 ENCOUNTER — Ambulatory Visit (INDEPENDENT_AMBULATORY_CARE_PROVIDER_SITE_OTHER): Payer: Medicare Other | Admitting: Orthopedic Surgery
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
## 2017-08-11 ENCOUNTER — Telehealth: Payer: Self-pay

## 2017-08-11 ENCOUNTER — Encounter: Payer: Self-pay | Admitting: Rheumatology

## 2017-08-11 ENCOUNTER — Ambulatory Visit: Payer: Medicare Other | Admitting: Rheumatology

## 2017-08-11 VITALS — BP 114/70 | HR 83 | Resp 16 | Ht 67.0 in | Wt 193.0 lb

## 2017-08-11 DIAGNOSIS — J45998 Other asthma: Secondary | ICD-10-CM

## 2017-08-11 DIAGNOSIS — M25512 Pain in left shoulder: Secondary | ICD-10-CM | POA: Diagnosis not present

## 2017-08-11 DIAGNOSIS — F5101 Primary insomnia: Secondary | ICD-10-CM

## 2017-08-11 DIAGNOSIS — Z8659 Personal history of other mental and behavioral disorders: Secondary | ICD-10-CM | POA: Diagnosis not present

## 2017-08-11 DIAGNOSIS — M961 Postlaminectomy syndrome, not elsewhere classified: Secondary | ICD-10-CM | POA: Diagnosis not present

## 2017-08-11 DIAGNOSIS — M810 Age-related osteoporosis without current pathological fracture: Secondary | ICD-10-CM

## 2017-08-11 DIAGNOSIS — S3219XD Other fracture of sacrum, subsequent encounter for fracture with routine healing: Secondary | ICD-10-CM | POA: Diagnosis not present

## 2017-08-11 DIAGNOSIS — G8929 Other chronic pain: Secondary | ICD-10-CM | POA: Diagnosis not present

## 2017-08-11 DIAGNOSIS — S322XXD Fracture of coccyx, subsequent encounter for fracture with routine healing: Secondary | ICD-10-CM

## 2017-08-11 DIAGNOSIS — M19041 Primary osteoarthritis, right hand: Secondary | ICD-10-CM

## 2017-08-11 DIAGNOSIS — M19042 Primary osteoarthritis, left hand: Secondary | ICD-10-CM

## 2017-08-11 DIAGNOSIS — R296 Repeated falls: Secondary | ICD-10-CM | POA: Diagnosis not present

## 2017-08-11 DIAGNOSIS — S3210XD Unspecified fracture of sacrum, subsequent encounter for fracture with routine healing: Secondary | ICD-10-CM

## 2017-08-11 NOTE — Telephone Encounter (Signed)
Was asked to complete a BIV for Reclast with pts insurance. PT [place]. [employer]ed insurance and spoke with [place]. She states that a pre-certification is not required. Her plans started on 07/21/17 with no end date. She has a PPO plans with at $350 copy for outpatient services and an annual out of pocket of $4500 (in-network) and $10,000 (out of network).  Called pt to update. Left message.  Zymire Turnbo, [place], CPhT 11:08 AM

## 2017-08-11 NOTE — Patient Instructions (Addendum)
Zoledronic Acid injection (Paget's Disease, Osteoporosis) What is this medicine? ZOLEDRONIC ACID (ZOE le dron ik AS id) lowers the amount of calcium loss from bone. It is used to treat Paget's disease and osteoporosis in women. This medicine may be used for other purposes; ask your health care provider or pharmacist if you have questions. COMMON BRAND NAME(S): Reclast, Zometa What should I tell my health care provider before I take this medicine? They need to know if you have any of these conditions: -aspirin-sensitive asthma -cancer, especially if you are receiving medicines used to treat cancer -dental disease or wear dentures -infection -kidney disease -low levels of calcium in the blood -past surgery on the parathyroid gland or intestines -receiving corticosteroids like dexamethasone or prednisone -an unusual or allergic reaction to zoledronic acid, other medicines, foods, dyes, or preservatives -pregnant or trying to get pregnant -breast-feeding How should I use this medicine? This medicine is for infusion into a vein. It is given by a health care professional in a hospital or clinic setting. Talk to your pediatrician regarding the use of this medicine in children. This medicine is not approved for use in children. Overdosage: If you think you have taken too much of this medicine contact a poison control center or emergency room at once. NOTE: This medicine is only for you. Do not share this medicine with others. What if I miss a dose? It is important not to miss your dose. Call your doctor or health care professional if you are unable to keep an appointment. What may interact with this medicine? -certain antibiotics given by injection -NSAIDs, medicines for pain and inflammation, like ibuprofen or naproxen -some diuretics like bumetanide, furosemide -teriparatide This list may not describe all possible interactions. Give your health care provider a list of all the medicines,  herbs, non-prescription drugs, or dietary supplements you use. Also tell them if you smoke, drink alcohol, or use illegal drugs. Some items may interact with your medicine. What should I watch for while using this medicine? Visit your doctor or health care professional for regular checkups. It may be some time before you see the benefit from this medicine. Do not stop taking your medicine unless your doctor tells you to. Your doctor may order blood tests or other tests to see how you are doing. Women should inform their doctor if they wish to become pregnant or think they might be pregnant. There is a potential for serious side effects to an unborn child. Talk to your health care professional or pharmacist for more information. You should make sure that you get enough calcium and vitamin D while you are taking this medicine. Discuss the foods you eat and the vitamins you take with your health care professional. Some people who take this medicine have severe bone, joint, and/or muscle pain. This medicine may also increase your risk for jaw problems or a broken thigh bone. Tell your doctor right away if you have severe pain in your jaw, bones, joints, or muscles. Tell your doctor if you have any pain that does not go away or that gets worse. Tell your dentist and dental surgeon that you are taking this medicine. You should not have major dental surgery while on this medicine. See your dentist to have a dental exam and fix any dental problems before starting this medicine. Take good care of your teeth while on this medicine. Make sure you see your dentist for regular follow-up appointments. What side effects may I notice from receiving this medicine?  Side effects that you should report to your doctor or health care professional as soon as possible: -allergic reactions like skin rash, itching or hives, swelling of the face, lips, or tongue -anxiety, confusion, or depression -breathing problems -changes in  vision -eye pain -feeling faint or lightheaded, falls -jaw pain, especially after dental work -mouth sores -muscle cramps, stiffness, or weakness -redness, blistering, peeling or loosening of the skin, including inside the mouth -trouble passing urine or change in the amount of urine Side effects that usually do not require medical attention (report to your doctor or health care professional if they continue or are bothersome): -bone, joint, or muscle pain -constipation -diarrhea -fever -hair loss -irritation at site where injected -loss of appetite -nausea, vomiting -stomach upset -trouble sleeping -trouble swallowing -weak or tired This list may not describe all possible side effects. Call your doctor for medical advice about side effects. You may report side effects to FDA at 1-800-FDA-1088. Where should I keep my medicine? This drug is given in a hospital or clinic and will not be stored at home. NOTE: This sheet is a summary. It may not cover all possible information. If you have questions about this medicine, talk to your doctor, pharmacist, or health care provider.  2018 Elsevier/Gold Standard (2013-12-03 14:19:57) Natural anti-inflammatories  You can purchase these at [facility], [employer] or online.  . Turmeric (capsules)  . Ginger (ginger root or capsules)  . Omega 3 (Fish, flax seeds, chia seeds, walnuts, almonds)  . Tart cherry (dried or extract)   Patient should be under the care of a physician while taking these supplements. This may not be reproduced without the permission of Dr. Pollyann SavoyShaili Deveshwar.

## 2017-08-11 NOTE — Telephone Encounter (Signed)
Patient returned call. She states that she is going to have to wait until her taxes come back before she can make the appointment. She will contact us when she is ready to proceed with the infusion.   Sumiko Ceasar, [place], CPhT 1:30 PM

## 2017-08-12 NOTE — Progress Notes (Signed)
Please call and vitamin D 50,000 units twice a week. Recheck vitamin D level in 3 months. The plan is to start her on Reclast after labs are available.

## 2017-08-14 ENCOUNTER — Telehealth: Payer: Self-pay | Admitting: *Deleted

## 2017-08-14 LAB — PROTEIN ELECTROPHORESIS, SERUM, WITH REFLEX
ALPHA 1: 0.4 g/dL — AB (ref 0.2–0.3)
Albumin ELP: 4.1 g/dL (ref 3.8–4.8)
Alpha 2: 0.9 g/dL (ref 0.5–0.9)
BETA 2: 0.5 g/dL (ref 0.2–0.5)
BETA GLOBULIN: 0.5 g/dL (ref 0.4–0.6)
Gamma Globulin: 1.2 g/dL (ref 0.8–1.7)
Total Protein: 7.5 g/dL (ref 6.1–8.1)

## 2017-08-14 LAB — COMPLETE METABOLIC PANEL WITH GFR
AG Ratio: 1.3 (calc) (ref 1.0–2.5)
ALBUMIN MSPROF: 4.2 g/dL (ref 3.6–5.1)
ALT: 17 U/L (ref 6–29)
AST: 19 U/L (ref 10–35)
Alkaline phosphatase (APISO): 75 U/L (ref 33–115)
BILIRUBIN TOTAL: 0.4 mg/dL (ref 0.2–1.2)
BUN: 16 mg/dL (ref 7–25)
CALCIUM: 9.4 mg/dL (ref 8.6–10.2)
CHLORIDE: 103 mmol/L (ref 98–110)
CO2: 25 mmol/L (ref 20–32)
CREATININE: 0.77 mg/dL (ref 0.50–1.10)
GFR, EST AFRICAN AMERICAN: 107 mL/min/{1.73_m2} (ref >=60)
GFR, EST NON AFRICAN AMERICAN: 92 mL/min/{1.73_m2} (ref >=60)
GLUCOSE: 95 mg/dL (ref 65–99)
Globulin: 3.3 g/dL (calc) (ref 1.9–3.7)
Potassium: 4.4 mmol/L (ref 3.5–5.3)
Sodium: 138 mmol/L (ref 135–146)
TOTAL PROTEIN: 7.5 g/dL (ref 6.1–8.1)

## 2017-08-14 LAB — CBC WITH DIFFERENTIAL/PLATELET
BASOS PCT: 0.9 %
Basophils Absolute: 82 cells/uL (ref 0–200)
EOS ABS: 555 {cells}/uL — AB (ref 15–500)
Eosinophils Relative: 6.1 %
HCT: 42.9 % (ref 35.0–45.0)
HEMOGLOBIN: 14 g/dL (ref 11.7–15.5)
Lymphs Abs: 2384 cells/uL (ref 850–3900)
MCH: 28.7 pg (ref 27.0–33.0)
MCHC: 32.6 g/dL (ref 32.0–36.0)
MCV: 87.9 fL (ref 80.0–100.0)
MONOS PCT: 5.5 %
MPV: 10.2 fL (ref 7.5–12.5)
NEUTROS ABS: 5578 {cells}/uL (ref 1500–7800)
Neutrophils Relative %: 61.3 %
PLATELETS: 349 10*3/uL (ref 140–400)
RBC: 4.88 10*6/uL (ref 3.80–5.10)
RDW: 12.4 % (ref 11.0–15.0)
TOTAL LYMPHOCYTE: 26.2 %
WBC mixed population: 501 cells/uL (ref 200–950)
WBC: 9.1 10*3/uL (ref 3.8–10.8)

## 2017-08-14 LAB — VITAMIN D 25 HYDROXY (VIT D DEFICIENCY, FRACTURES): Vit D, 25-Hydroxy: 27 ng/mL — ABNORMAL LOW (ref 30–100)

## 2017-08-14 LAB — URINALYSIS, ROUTINE W REFLEX MICROSCOPIC
BILIRUBIN URINE: NEGATIVE
GLUCOSE, UA: NEGATIVE
Hgb urine dipstick: NEGATIVE
Ketones, ur: NEGATIVE
LEUKOCYTES UA: NEGATIVE
Nitrite: NEGATIVE
PROTEIN: NEGATIVE
SPECIFIC GRAVITY, URINE: 1.036 — AB (ref 1.001–1.03)
pH: <=5 (ref 5.0–8.0)

## 2017-08-14 LAB — PARATHYROID HORMONE, INTACT (NO CA): PTH: 60 pg/mL (ref 14–64)

## 2017-08-14 LAB — IFE INTERPRETATION: IMMUNOFIX ELECTR INT: NOT DETECTED

## 2017-08-14 LAB — TSH: TSH: 1.49 m[IU]/L

## 2017-08-14 MED ORDER — VITAMIN D (ERGOCALCIFEROL) 1.25 MG (50000 UNIT) PO CAPS: 50000.0000 [IU] | ORAL_CAPSULE | ORAL | 0 refills | Status: DC

## 2017-08-14 NOTE — Telephone Encounter (Signed)
-----   Message from Natalie SavoyShaili Deveshwar, MD sent at 08/12/2017 10:52 AM EST ----- Please call and vitamin D 50,000 units twice a week. Recheck vitamin D level in 3 months. The plan is to start her on Reclast after labs are available.

## 2017-08-17 ENCOUNTER — Ambulatory Visit (INDEPENDENT_AMBULATORY_CARE_PROVIDER_SITE_OTHER): Payer: Medicare Other | Admitting: Orthopedic Surgery

## 2017-08-17 ENCOUNTER — Encounter (INDEPENDENT_AMBULATORY_CARE_PROVIDER_SITE_OTHER): Payer: Self-pay | Admitting: Orthopedic Surgery

## 2017-08-17 VITALS — Ht 67.0 in | Wt 193.0 lb

## 2017-08-17 DIAGNOSIS — M7542 Impingement syndrome of left shoulder: Secondary | ICD-10-CM
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
## 2017-08-20 ENCOUNTER — Ambulatory Visit (INDEPENDENT_AMBULATORY_CARE_PROVIDER_SITE_OTHER): Payer: Medicare Other | Admitting: Specialist

## 2017-08-20 ENCOUNTER — Encounter (INDEPENDENT_AMBULATORY_CARE_PROVIDER_SITE_OTHER): Payer: Self-pay | Admitting: Specialist

## 2017-08-20 ENCOUNTER — Ambulatory Visit (INDEPENDENT_AMBULATORY_CARE_PROVIDER_SITE_OTHER): Payer: Medicare Other

## 2017-08-20 VITALS — BP 118/69 | HR 71 | Ht 67.0 in | Wt 193.0 lb

## 2017-08-20 DIAGNOSIS — M48062 Spinal stenosis, lumbar region with neurogenic claudication: Secondary | ICD-10-CM

## 2017-08-20 DIAGNOSIS — M7542 Impingement syndrome of left shoulder: Secondary | ICD-10-CM

## 2017-08-20 DIAGNOSIS — M810 Age-related osteoporosis without current pathological fracture: Secondary | ICD-10-CM

## 2017-08-20 DIAGNOSIS — M4326 Fusion of spine, lumbar region: Secondary | ICD-10-CM

## 2017-08-20 DIAGNOSIS — M5136 Other intervertebral disc degeneration, lumbar region: Secondary | ICD-10-CM

## 2017-08-20 DIAGNOSIS — M4322 Fusion of spine, cervical region: Secondary | ICD-10-CM

## 2017-08-20 MED ORDER — ACETAMINOPHEN-CODEINE #3 300-30 MG PO TABS: 1.0000 | ORAL_TABLET | Freq: Three times a day (TID) | ORAL | 0 refills | Status: DC | PRN

## 2017-08-20 MED ORDER — MELOXICAM 15 MG PO TABS: 15.0000 mg | ORAL_TABLET | Freq: Every day | ORAL | 3 refills | Status: DC

## 2017-08-20 NOTE — Patient Instructions (Signed)
Avoid frequent bending and stooping  No lifting greater than 10 lbs. May use ice or moist heat for pain. Weight loss is of benefit. Handicap license is approved.  Avoid overhead lifting and overhead use of the arms. Do not lift greater than 10 lbs. Tylenol ES one every 6-8 hours for pain and inflamation. Max of 6 tablets tylenol per day Include any tylenol #3 and over the counter sleep aids or cold medication Continue with PT for another 2-3 weeks, then a home exercise program. May take meloxicam once a day or motrin but not both daily as the combination can have a combined burden on your kidneys and can damage the kidneys.
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
## 2017-08-20 NOTE — Progress Notes (Deleted)
Avoid frequent bending and stooping  No lifting greater than 10 lbs. May use ice or moist heat for pain. Weight loss is of benefit. Handicap license is approved.   

## 2017-08-24 ENCOUNTER — Ambulatory Visit: Payer: Medicare Other | Admitting: Internal Medicine

## 2017-08-25 ENCOUNTER — Other Ambulatory Visit: Payer: Self-pay | Admitting: Internal Medicine

## 2017-08-25 MED ORDER — FLUTICASONE-SALMETEROL 500-50 MCG/DOSE IN AEPB: 1.0000 | INHALATION_SPRAY | Freq: Two times a day (BID) | RESPIRATORY_TRACT | 11 refills | Status: DC

## 2017-09-01 ENCOUNTER — Telehealth (INDEPENDENT_AMBULATORY_CARE_PROVIDER_SITE_OTHER): Payer: Self-pay | Admitting: Specialist

## 2017-09-01 NOTE — Telephone Encounter (Signed)
Med refill   Meloxicam (Mobic) 15 mg tablet  Pt requested quantity of 100     [facility]  [phone]

## 2017-09-08 MED ORDER — MELOXICAM 15 MG PO TABS: 15.0000 mg | ORAL_TABLET | Freq: Every day | ORAL | 3 refills | Status: DC

## 2017-09-09 ENCOUNTER — Telehealth (INDEPENDENT_AMBULATORY_CARE_PROVIDER_SITE_OTHER): Payer: Self-pay | Admitting: Specialist

## 2017-09-09 DIAGNOSIS — F419 Anxiety disorder, unspecified: Secondary | ICD-10-CM

## 2017-09-09 DIAGNOSIS — E559 Vitamin D deficiency, unspecified: Secondary | ICD-10-CM | POA: Insufficient documentation

## 2017-09-09 DIAGNOSIS — M19042 Primary osteoarthritis, left hand: Secondary | ICD-10-CM

## 2017-09-09 DIAGNOSIS — F5101 Primary insomnia: Secondary | ICD-10-CM | POA: Insufficient documentation

## 2017-09-09 DIAGNOSIS — M7542 Impingement syndrome of left shoulder: Secondary | ICD-10-CM | POA: Insufficient documentation

## 2017-09-09 DIAGNOSIS — M5030 Other cervical disc degeneration, unspecified cervical region: Secondary | ICD-10-CM | POA: Insufficient documentation

## 2017-09-09 DIAGNOSIS — R296 Repeated falls: Secondary | ICD-10-CM | POA: Insufficient documentation

## 2017-09-09 DIAGNOSIS — M19041 Primary osteoarthritis, right hand: Secondary | ICD-10-CM | POA: Insufficient documentation

## 2017-09-09 DIAGNOSIS — F329 Major depressive disorder, single episode, unspecified: Secondary | ICD-10-CM | POA: Insufficient documentation

## 2017-09-09 NOTE — Telephone Encounter (Addendum)
Patient called and left voicemail to check on this refill, saying it hasn't been done yet. Please advise  # [phone]

## 2017-09-09 NOTE — Telephone Encounter (Signed)
error 

## 2017-09-09 NOTE — Telephone Encounter (Signed)
I called and advised the rx was sent in 09/08/2017
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
## 2017-09-09 NOTE — Telephone Encounter (Signed)
Patient said only 30 was called in and needed a quantity of 100 due to the price difference as well as how long it takes to get it. Can we change the quantity for patient?

## 2017-09-10 ENCOUNTER — Other Ambulatory Visit (INDEPENDENT_AMBULATORY_CARE_PROVIDER_SITE_OTHER): Payer: Self-pay | Admitting: Radiology

## 2017-09-10 MED ORDER — MELOXICAM 15 MG PO TABS: 15.0000 mg | ORAL_TABLET | Freq: Every day | ORAL | 3 refills | Status: DC

## 2017-09-10 NOTE — Telephone Encounter (Signed)
I spoke with Dr. Otelia SergeantNitka and he states that it is ok to send in for the qty of 100 of the meloxicam.  I sent the rx to the [email or web address].

## 2017-09-10 NOTE — Telephone Encounter (Signed)
I called and lmom advising patient 
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
## 2017-09-21 ENCOUNTER — Ambulatory Visit: Payer: Medicare Other | Admitting: Rheumatology

## 2017-09-30 ENCOUNTER — Encounter: Payer: Self-pay | Admitting: Rheumatology

## 2017-09-30 ENCOUNTER — Other Ambulatory Visit (INDEPENDENT_AMBULATORY_CARE_PROVIDER_SITE_OTHER): Payer: Self-pay | Admitting: Specialist

## 2017-09-30 ENCOUNTER — Ambulatory Visit: Payer: Medicare Other | Admitting: Rheumatology

## 2017-09-30 VITALS — BP 110/72 | HR 77 | Resp 16 | Ht 67.0 in | Wt 189.5 lb

## 2017-09-30 DIAGNOSIS — M19041 Primary osteoarthritis, right hand: Secondary | ICD-10-CM

## 2017-09-30 DIAGNOSIS — M810 Age-related osteoporosis without current pathological fracture: Secondary | ICD-10-CM

## 2017-09-30 DIAGNOSIS — M5030 Other cervical disc degeneration, unspecified cervical region: Secondary | ICD-10-CM

## 2017-09-30 DIAGNOSIS — J45998 Other asthma: Secondary | ICD-10-CM | POA: Diagnosis not present

## 2017-09-30 DIAGNOSIS — F5101 Primary insomnia: Secondary | ICD-10-CM | POA: Diagnosis not present

## 2017-09-30 DIAGNOSIS — M7542 Impingement syndrome of left shoulder: Secondary | ICD-10-CM | POA: Diagnosis not present

## 2017-09-30 DIAGNOSIS — F419 Anxiety disorder, unspecified: Secondary | ICD-10-CM

## 2017-09-30 DIAGNOSIS — F329 Major depressive disorder, single episode, unspecified: Secondary | ICD-10-CM | POA: Diagnosis not present

## 2017-09-30 DIAGNOSIS — M19042 Primary osteoarthritis, left hand: Secondary | ICD-10-CM | POA: Diagnosis not present

## 2017-09-30 DIAGNOSIS — R296 Repeated falls: Secondary | ICD-10-CM | POA: Diagnosis not present

## 2017-09-30 DIAGNOSIS — M5136 Other intervertebral disc degeneration, lumbar region: Secondary | ICD-10-CM | POA: Diagnosis not present

## 2017-09-30 DIAGNOSIS — E559 Vitamin D deficiency, unspecified: Secondary | ICD-10-CM | POA: Diagnosis not present

## 2017-10-07 ENCOUNTER — Telehealth (INDEPENDENT_AMBULATORY_CARE_PROVIDER_SITE_OTHER): Payer: Self-pay | Admitting: Specialist

## 2017-10-07 MED ORDER — MELOXICAM 15 MG PO TABS: 15.0000 mg | ORAL_TABLET | Freq: Every day | ORAL | 3 refills | Status: DC

## 2017-10-07 NOTE — Addendum Note (Signed)
Addended by: Penne LashSHUE WILLS, Otis DialsHRISTY N on: 10/07/2017 03:18 PM   Modules accepted: Orders

## 2017-10-07 NOTE — Telephone Encounter (Signed)
I called and lmom for pt to let her know that we sent in her Mobic (Meloxicam) 15 mg, 100 tablets back on 09/10/2017 to [facility] and we sent in for 3 refills.

## 2017-10-07 NOTE — Telephone Encounter (Signed)
I resent rx to [facility].  I called and advised patient that this has been done.

## 2017-10-07 NOTE — Telephone Encounter (Signed)
Patient called stating that the pharmacy did not receive the RX for meloxicam. Asking if it could be faxed over again. Thank you. CB # [phone]

## 2017-10-07 NOTE — Telephone Encounter (Signed)
Patient left a vm message in regards to her mail in pharmacy [facility]) faxed in a request for a generic medication (which I could not understand what she said) and have not heard anything back from us.  Their fax #[phone] and tele: [phone].  Her CB#[phone].  Thank you.

## 2017-10-08 NOTE — Telephone Encounter (Signed)
Patient called to let you know that the pharmacy said that have not received the prescription even after you faxed it.  She stated that if you call them there is an option to speak to the pharmacist or you can try and refax it.  Patient's number is [phone].  She said you do not need to call her back.  Thank you.

## 2017-10-09 NOTE — Telephone Encounter (Signed)
I rec'd an order for the pharmacy and it was completed and [facility]ed [facility].  I called and Lmom for patient to advise.

## 2017-10-21 ENCOUNTER — Other Ambulatory Visit: Payer: Self-pay | Admitting: *Deleted

## 2017-10-21 DIAGNOSIS — E559 Vitamin D deficiency, unspecified: Secondary | ICD-10-CM

## 2017-10-22 LAB — VITAMIN D 25 HYDROXY (VIT D DEFICIENCY, FRACTURES): Vit D, 25-Hydroxy: 28 ng/mL — ABNORMAL LOW (ref 30–100)

## 2017-10-22 NOTE — Progress Notes (Signed)
Give vitamin D 50,000 units twice a week for 3 months.  Please advise her to get labs in 3 months after that.

## 2017-10-28 ENCOUNTER — Telehealth: Payer: Self-pay | Admitting: *Deleted

## 2017-10-28 DIAGNOSIS — E559 Vitamin D deficiency, unspecified: Secondary | ICD-10-CM

## 2017-10-28 MED ORDER — VITAMIN D (ERGOCALCIFEROL) 1.25 MG (50000 UNIT) PO CAPS: 50000.0000 [IU] | ORAL_CAPSULE | ORAL | 0 refills | Status: DC

## 2017-10-28 NOTE — Telephone Encounter (Signed)
-----   Message from Pollyann SavoyShaili Deveshwar, MD sent at 10/22/2017 12:34 PM EDT ----- Give vitamin D 50,000 units twice a week for 3 months.  Please advise her to get labs in 3 months after that.

## 2017-11-18 ENCOUNTER — Ambulatory Visit: Payer: Medicare Other | Admitting: Internal Medicine

## 2017-11-26 ENCOUNTER — Ambulatory Visit (INDEPENDENT_AMBULATORY_CARE_PROVIDER_SITE_OTHER): Payer: Medicare Other | Admitting: Specialist

## 2017-12-29 ENCOUNTER — Telehealth: Payer: Self-pay | Admitting: Rheumatology

## 2017-12-29 NOTE — Telephone Encounter (Signed)
Spoke with patient and advised patient that the prescription was sent in on 10/28/17 for 2 capsules twice weekly. Patient states the pharmacy did not receive it. Contacted pharmacy and spoke with the pharmacist. They states they did receive it and accidentally left the prescription with the original directions. They will contact the patient and let her know.

## 2017-12-29 NOTE — Telephone Encounter (Signed)
Patient called requesting prescription refill of Vitamin D to be sent to [facility] on [address].  Patient states she was increased to 2 pills per week, but the pharmacy doesn't have a record of the increase.  Patient states she would also like to get a 3 month supply because it is less expensive.

## 2018-01-13 ENCOUNTER — Ambulatory Visit (INDEPENDENT_AMBULATORY_CARE_PROVIDER_SITE_OTHER): Payer: Medicare Other | Admitting: Specialist

## 2018-02-04 ENCOUNTER — Ambulatory Visit (INDEPENDENT_AMBULATORY_CARE_PROVIDER_SITE_OTHER): Payer: Medicare Other | Admitting: Specialist

## 2018-02-04 ENCOUNTER — Other Ambulatory Visit: Payer: Self-pay

## 2018-02-04 ENCOUNTER — Encounter (INDEPENDENT_AMBULATORY_CARE_PROVIDER_SITE_OTHER): Payer: Self-pay | Admitting: Specialist

## 2018-02-04 VITALS — BP 135/81 | HR 94 | Ht 67.0 in | Wt 193.0 lb

## 2018-02-04 DIAGNOSIS — M4322 Fusion of spine, cervical region: Secondary | ICD-10-CM

## 2018-02-04 DIAGNOSIS — M5030 Other cervical disc degeneration, unspecified cervical region: Secondary | ICD-10-CM

## 2018-02-04 DIAGNOSIS — E559 Vitamin D deficiency, unspecified: Secondary | ICD-10-CM

## 2018-02-04 DIAGNOSIS — M5136 Other intervertebral disc degeneration, lumbar region: Secondary | ICD-10-CM

## 2018-02-04 DIAGNOSIS — Z981 Arthrodesis status: Secondary | ICD-10-CM | POA: Diagnosis not present

## 2018-02-04 DIAGNOSIS — M810 Age-related osteoporosis without current pathological fracture: Secondary | ICD-10-CM

## 2018-02-04 MED ORDER — ACETAMINOPHEN-CODEINE #4 300-60 MG PO TABS: 1.0000 | ORAL_TABLET | ORAL | 0 refills | Status: DC | PRN

## 2018-02-04 NOTE — Patient Instructions (Addendum)
Avoid overhead lifting and overhead use of the arms. Do not lift greater than 5 lbs. Adjust head rest in vehicle to prevent hyperextension if rear ended. Take extra precautions to avoid falling.Avoid frequent bending and stooping  No lifting greater than 10 lbs. May use ice or moist heat for pain. Weight loss is of benefit. Handicap license is approved.  3 level fusion cervical spine and 2 level fusion lumbar spine with adjacent level DDD in both the cervical spine and lumbar spine. Bilateral shoulder impingment and a candidate for left shoulder arthroscopy. Osteoporosis. She is not capable of gainful employment on a full time basis. Tylenol #4
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
## 2018-02-05 LAB — VITAMIN D 25 HYDROXY (VIT D DEFICIENCY, FRACTURES): VIT D 25 HYDROXY: 50 ng/mL (ref 30–100)

## 2018-02-05 NOTE — Progress Notes (Signed)
I spoke with patient.  She has been taking vitamin D 50,000 units twice a week.  Her vitamin D is normal now.  I have advised her to stay on vitamin D 50,000 units twice a month.  We will recheck her vitamin D level in 6 months.  Please call in a prescription for her.

## 2018-02-08 ENCOUNTER — Telehealth: Payer: Self-pay | Admitting: *Deleted

## 2018-02-08 MED ORDER — VITAMIN D (ERGOCALCIFEROL) 1.25 MG (50000 UNIT) PO CAPS: 50000.0000 [IU] | ORAL_CAPSULE | ORAL | 1 refills | Status: DC

## 2018-02-08 NOTE — Telephone Encounter (Signed)
-----   Message from Pollyann SavoyShaili Deveshwar, MD sent at 02/05/2018  2:05 PM EDT ----- I spoke with patient.  She has been taking vitamin D 50,000 units twice a week.  Her vitamin D is normal now.  I have advised her to stay on vitamin D 50,000 units twice a month.  We will recheck her vitamin D level in 6 months.  Please call in a pre scription for her.

## 2018-03-03 ENCOUNTER — Ambulatory Visit: Payer: Medicare Other | Admitting: Internal Medicine

## 2018-05-27 ENCOUNTER — Encounter: Payer: Self-pay | Admitting: Internal Medicine

## 2018-05-27 ENCOUNTER — Ambulatory Visit: Payer: Medicare Other | Admitting: Internal Medicine

## 2018-05-27 VITALS — BP 128/82 | HR 92 | Ht 67.0 in | Wt 188.0 lb

## 2018-05-27 DIAGNOSIS — J45998 Other asthma: Secondary | ICD-10-CM

## 2018-05-27 DIAGNOSIS — J4530 Mild persistent asthma, uncomplicated: Secondary | ICD-10-CM | POA: Diagnosis not present

## 2018-05-27 DIAGNOSIS — Z23 Encounter for immunization: Secondary | ICD-10-CM

## 2018-05-27 DIAGNOSIS — J302 Other seasonal allergic rhinitis: Secondary | ICD-10-CM

## 2018-05-27 DIAGNOSIS — J3089 Other allergic rhinitis: Secondary | ICD-10-CM

## 2018-05-27 MED ORDER — PHENYLEPHRINE HCL 1 % NA SOLN
3.0000 [drp] | Freq: Once | NASAL | Status: AC
  Administered 2018-05-27: 3 [drp] via NASAL

## 2018-05-27 MED ORDER — METHYLPREDNISOLONE ACETATE 80 MG/ML IJ SUSP
80.0000 mg | Freq: Once | INTRAMUSCULAR | Status: AC
  Administered 2018-05-27: 80 mg via INTRAMUSCULAR

## 2018-05-27 MED ORDER — MECLIZINE HCL 25 MG PO TABS: 25.0000 mg | ORAL_TABLET | Freq: Three times a day (TID) | ORAL | 12 refills | Status: DC | PRN

## 2018-05-27 MED ORDER — MONTELUKAST SODIUM 10 MG PO TABS: 10.0000 mg | ORAL_TABLET | Freq: Every day | ORAL | 11 refills | Status: DC

## 2018-05-27 NOTE — Assessment & Plan Note (Signed)
We would like a little better control if we can get it. Plan-okay to drop back from Advair 500 to Advair 250 when able.  Try adding Singulair.

## 2018-05-27 NOTE — Assessment & Plan Note (Signed)
At sometimes severe Flonase and antihistamines are not quite sufficient. Plan-nasal Neo-Synephrine decongestant treatment today, Depo-Medrol, Singulair

## 2018-05-27 NOTE — Patient Instructions (Signed)
Script sent to try adding Singulair for better control of asthma and nasal symptoms  Order- Flu vax standard  Order- neb neo nasal       Dx seasonal and perennial allergic rhinitis             Depo 80
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
## 2018-06-02 ENCOUNTER — Encounter (INDEPENDENT_AMBULATORY_CARE_PROVIDER_SITE_OTHER): Payer: Self-pay | Admitting: Specialist

## 2018-09-16 ENCOUNTER — Ambulatory Visit: Payer: Self-pay | Admitting: Psychiatry

## 2018-10-02 ENCOUNTER — Other Ambulatory Visit (INDEPENDENT_AMBULATORY_CARE_PROVIDER_SITE_OTHER): Payer: Self-pay | Admitting: Specialist

## 2018-10-04 NOTE — Telephone Encounter (Signed)
meloxicam refill request 

## 2018-10-05 ENCOUNTER — Other Ambulatory Visit (INDEPENDENT_AMBULATORY_CARE_PROVIDER_SITE_OTHER): Payer: Self-pay | Admitting: Specialist

## 2018-10-05 NOTE — Telephone Encounter (Signed)
meloxicam refill request 

## 2018-10-07 MED ORDER — FLUTICASONE-SALMETEROL 500-50 MCG/DOSE IN AEPB: 1.0000 | INHALATION_SPRAY | Freq: Two times a day (BID) | RESPIRATORY_TRACT | 5 refills | Status: DC

## 2018-10-07 MED ORDER — ALBUTEROL SULFATE HFA 108 (90 BASE) MCG/ACT IN AERS: INHALATION_SPRAY | RESPIRATORY_TRACT | 5 refills | Status: DC

## 2018-12-21 ENCOUNTER — Other Ambulatory Visit: Payer: Self-pay

## 2018-12-21 MED ORDER — LORAZEPAM 1 MG PO TABS: 1.0000 mg | ORAL_TABLET | Freq: Four times a day (QID) | ORAL | 0 refills | Status: DC

## 2019-01-13 ENCOUNTER — Telehealth: Payer: Self-pay | Admitting: Psychiatry

## 2019-01-13 ENCOUNTER — Ambulatory Visit (INDEPENDENT_AMBULATORY_CARE_PROVIDER_SITE_OTHER): Payer: Medicare Other | Admitting: Psychiatry

## 2019-01-13 ENCOUNTER — Encounter: Payer: Self-pay | Admitting: Psychiatry

## 2019-01-13 ENCOUNTER — Other Ambulatory Visit: Payer: Self-pay

## 2019-01-13 DIAGNOSIS — F3281 Premenstrual dysphoric disorder: Secondary | ICD-10-CM

## 2019-01-13 DIAGNOSIS — F411 Generalized anxiety disorder: Secondary | ICD-10-CM

## 2019-01-13 DIAGNOSIS — F3340 Major depressive disorder, recurrent, in remission, unspecified: Secondary | ICD-10-CM

## 2019-01-13 DIAGNOSIS — F4001 Agoraphobia with panic disorder: Secondary | ICD-10-CM
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
## 2019-01-13 NOTE — Telephone Encounter (Signed)
I'm sorry that I don't know anything about that at all.

## 2019-01-13 NOTE — Telephone Encounter (Signed)
Pt was seen today but omitted to ask a question. Would like to know your thoughts on a stellate ganglon block. Stated her research found it is used for cervical pain, PTSD,and anxiety symptoms. Please call .

## 2019-01-14 NOTE — Telephone Encounter (Signed)
Left voice mail to call back 

## 2019-01-28 ENCOUNTER — Other Ambulatory Visit: Payer: Self-pay | Admitting: Psychiatry

## 2019-01-31 ENCOUNTER — Telehealth: Payer: Self-pay | Admitting: Psychiatry

## 2019-01-31 NOTE — Telephone Encounter (Signed)
Patient left vm 07/10 @3 :40 [facility] [address] waiting on response to refill medication, pt did not state which medication

## 2019-01-31 NOTE — Telephone Encounter (Signed)
Refills called into pharmacy

## 2019-02-17 ENCOUNTER — Other Ambulatory Visit: Payer: Self-pay | Admitting: Rheumatology

## 2019-02-18 ENCOUNTER — Telehealth: Payer: Self-pay | Admitting: Rheumatology

## 2019-02-18 NOTE — Telephone Encounter (Signed)
Patient left a voicemail stating she has been quarantined in [place] for the last several months due to [place] and stay at home order.  Patient states she just returned today.  Patient states she has not been taking her Vitamin D supplement and requesting a prescription be sent to [facility].  Patient states the prescription is less expensive if the quantity is higher (20 tablets).  Patient is requesting a return call to let her know how long she should be on the supplement before scheduling a return office visit.

## 2019-02-18 NOTE — Telephone Encounter (Signed)
Attempted to contact the patient and left message to advise patient she will need to contact the office to schedule an appointment prior to any refills.

## 2019-02-20 ENCOUNTER — Other Ambulatory Visit: Payer: Self-pay | Admitting: Rheumatology

## 2019-02-28 ENCOUNTER — Other Ambulatory Visit: Payer: Self-pay | Admitting: Internal Medicine

## 2019-04-18 ENCOUNTER — Other Ambulatory Visit: Payer: Self-pay

## 2019-04-18 MED ORDER — DULOXETINE HCL 60 MG PO CPEP: 60.0000 mg | ORAL_CAPSULE | Freq: Two times a day (BID) | ORAL | 1 refills | Status: DC

## 2019-04-21 ENCOUNTER — Telehealth: Payer: Self-pay | Admitting: Psychiatry

## 2019-04-21 ENCOUNTER — Other Ambulatory Visit: Payer: Self-pay

## 2019-04-21 MED ORDER — DULOXETINE HCL 60 MG PO CPEP: 60.0000 mg | ORAL_CAPSULE | Freq: Two times a day (BID) | ORAL | 3 refills | Status: DC

## 2019-04-21 NOTE — Telephone Encounter (Signed)
Pt left v-mail about duloxetine # 240 only received #180 from mail order. Please advise why the decrease. [phone]

## 2019-04-21 NOTE — Telephone Encounter (Signed)
Discussed with patient and she normally gets #240 with 3 refills due to her mail order it's easier to get refills done in a timely manner. Will submitted updated quantities.

## 2019-04-27 ENCOUNTER — Other Ambulatory Visit: Payer: Self-pay

## 2019-04-27 MED ORDER — DULOXETINE HCL 60 MG PO CPEP: 60.0000 mg | ORAL_CAPSULE | Freq: Two times a day (BID) | ORAL | 3 refills | Status: DC

## 2019-06-04 ENCOUNTER — Other Ambulatory Visit: Payer: Self-pay | Admitting: Psychiatry

## 2019-06-05 NOTE — Telephone Encounter (Signed)
Due back in Dec 

## 2019-07-01 ENCOUNTER — Other Ambulatory Visit: Payer: Self-pay | Admitting: Psychiatry

## 2019-08-10 IMAGING — MR MR SHOULDER*L* W/O CM
4 of 5 series · 30 of 40 positions shown · non-contrast
Comparison: None.

CLINICAL DATA: Chronic progressive left shoulder pain with painful
range of motion. Popping and grinding in the left shoulder.
Weakness.

EXAM:
MRI OF THE LEFT SHOULDER WITHOUT CONTRAST
TECHNIQUE: Multiplanar, multisequence MR imaging of the shoulder was performed.
No intravenous contrast was administered.

[Series 3: T2 fat-sat · axial · 4.0mm · 0.25mm/px · z∈[-69,+22]mm · 8 of 23 slices shown (1 of 3)]
[im 1/23]
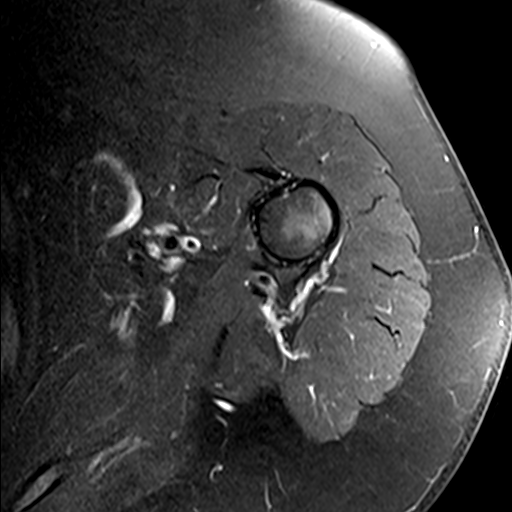
[im 3/23]
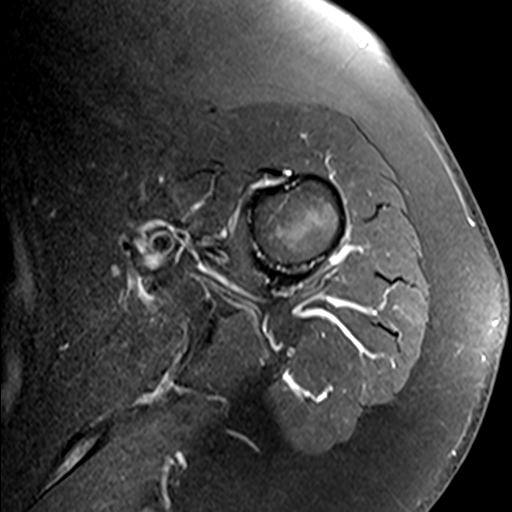
[im 8/23]
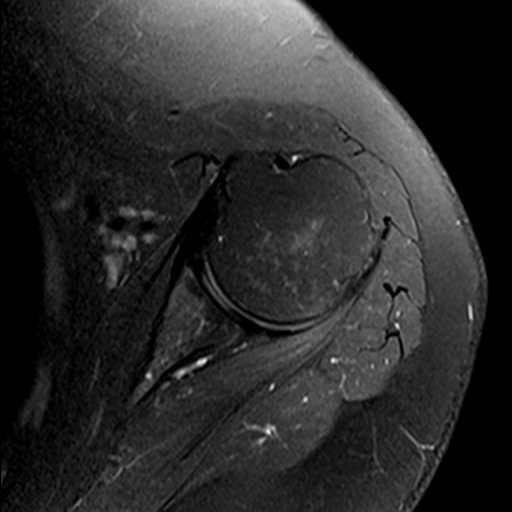
[im 10/23]
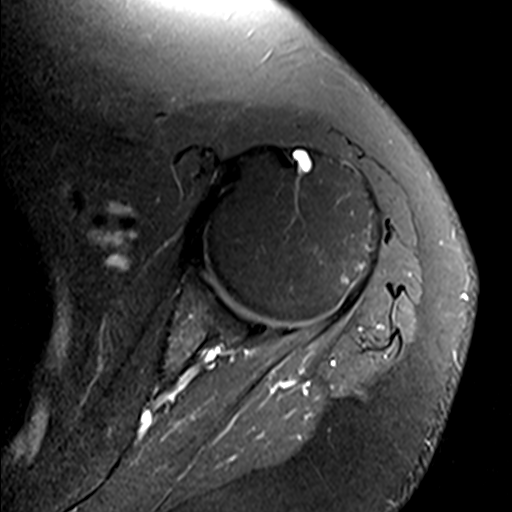
[im 13/23]
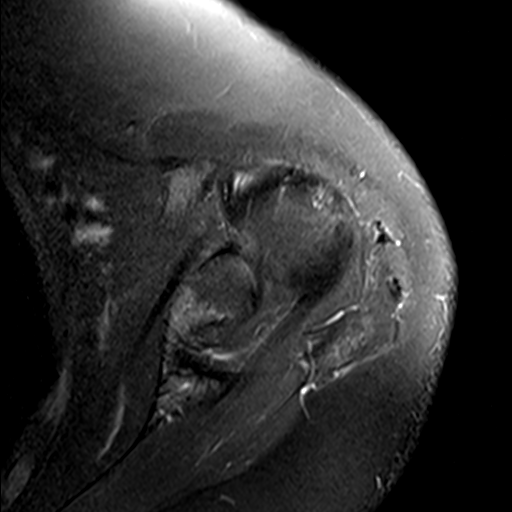
[im 15/23]
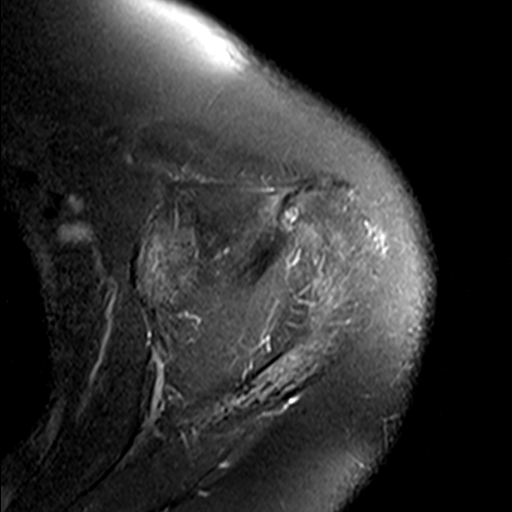
[im 20/23]
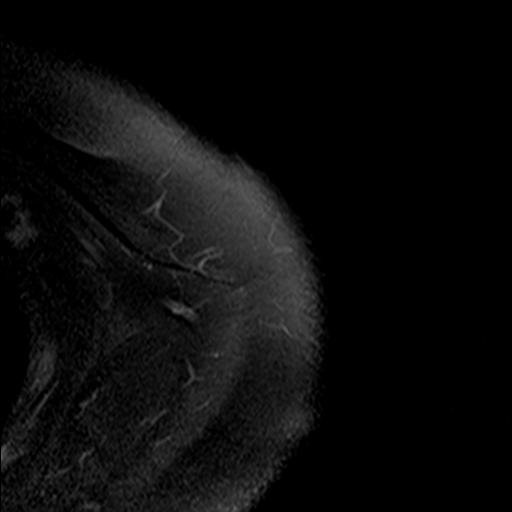
[im 23/23]
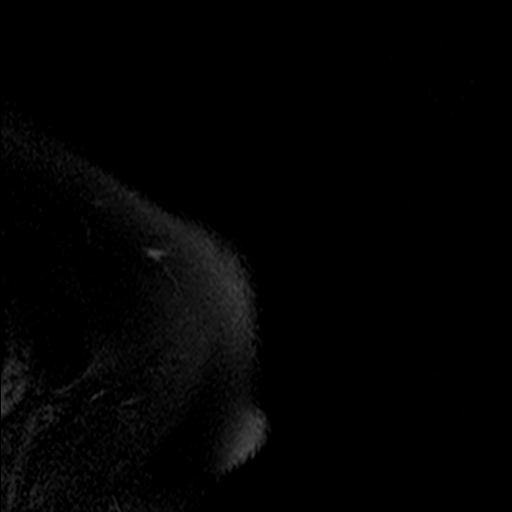

[Series 4: T2 fat-sat · oblique · 4.0mm · 0.55mm/px · 8 of 21 slices shown (2 of 3)]
[im 1/21]
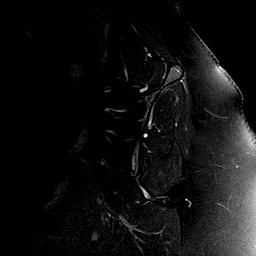
[im 3/21]
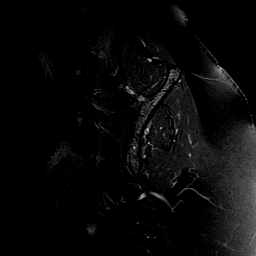
[im 6/21]
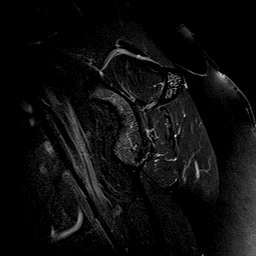
[im 9/21]
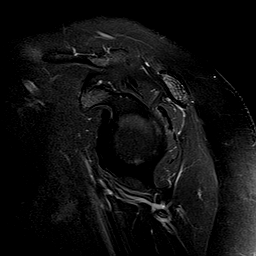
[im 12/21]
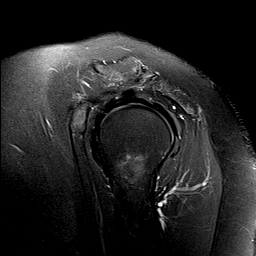
[im 15/21]
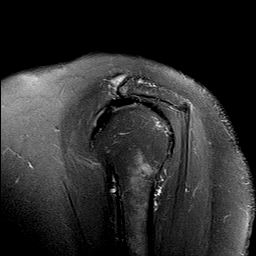
[im 18/21]
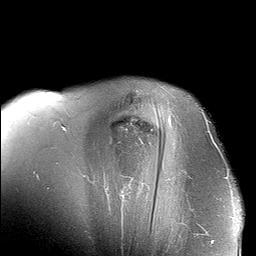
[im 21/21]
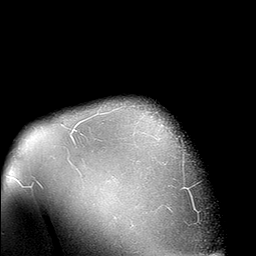

[Series 7: T2 fat-sat · oblique · 4.0mm · 0.55mm/px · 7 of 19 slices shown (3 of 3)]
[im 1/19]
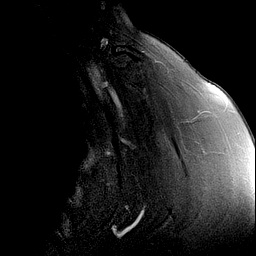
[im 4/19]
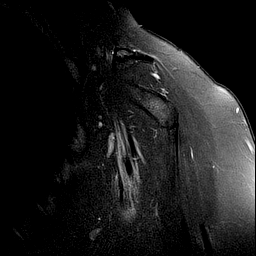
[im 7/19]
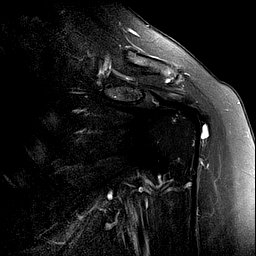
[im 10/19]
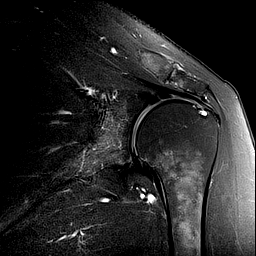
[im 13/19]
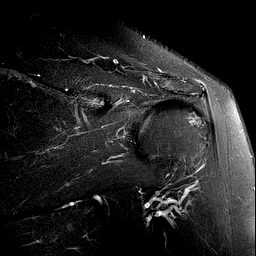
[im 16/19]
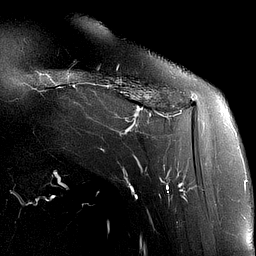
[im 19/19]
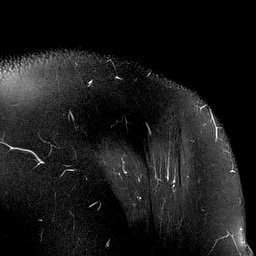

[Series 9: PD · oblique · 4.0mm · 0.27mm/px · 7 of 19 slices shown]
[im 1/19]
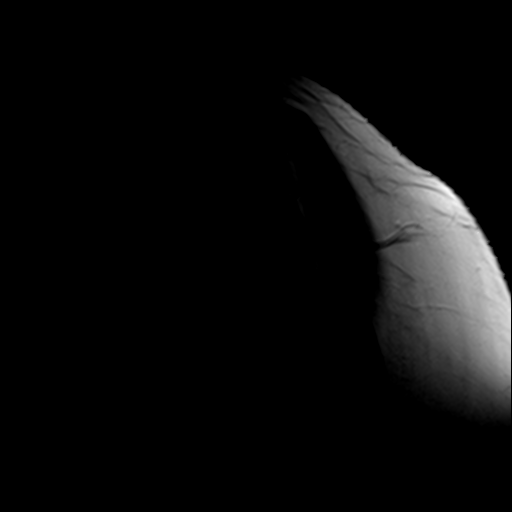
[im 4/19]
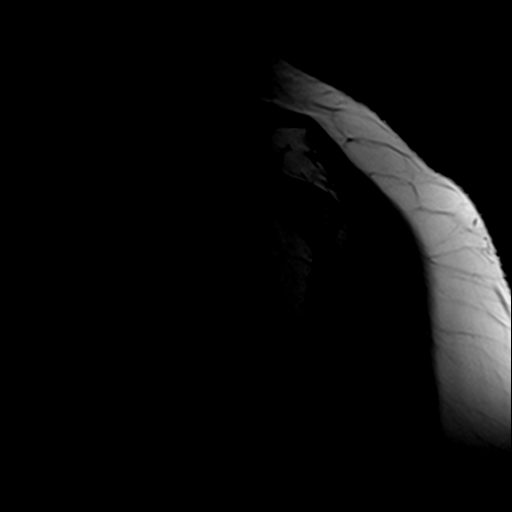
[im 7/19]
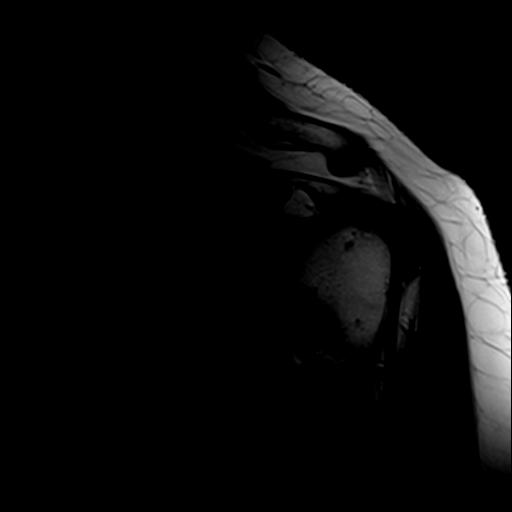
[im 10/19]
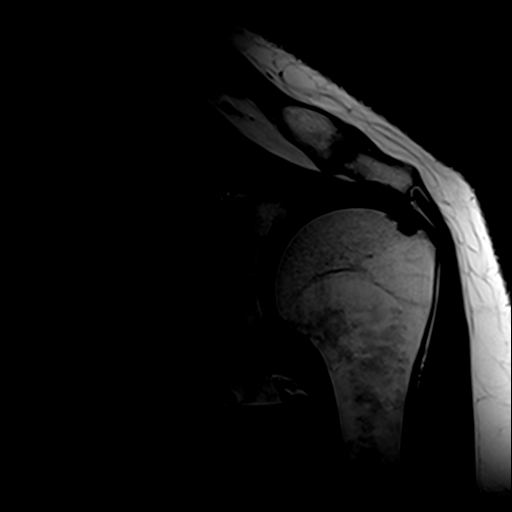
[im 13/19]
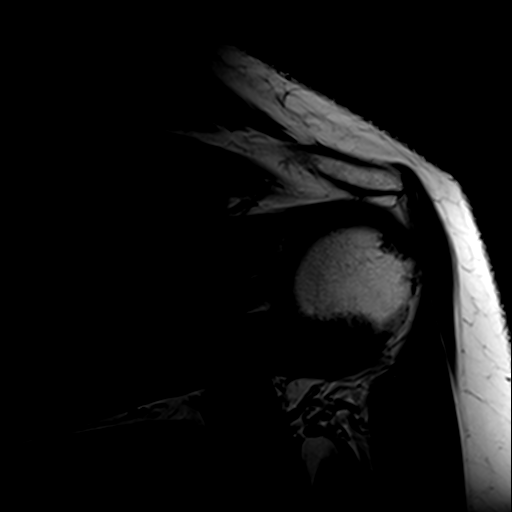
[im 16/19]
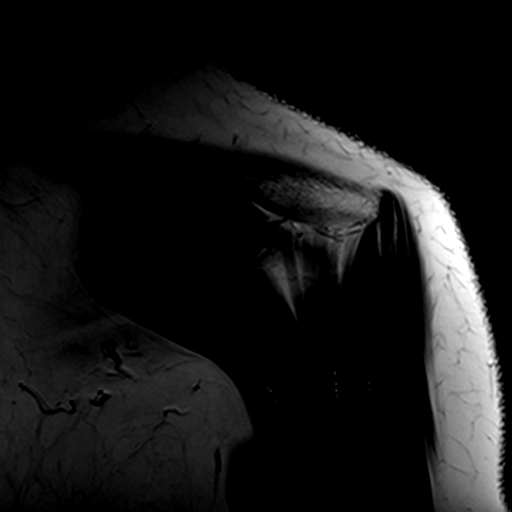
[im 19/19]
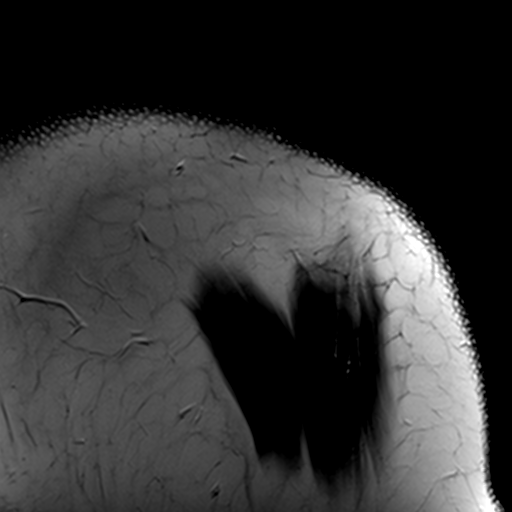

[30 of 40 positions shown; findings below may reference images not displayed]

FINDINGS: Rotator cuff: Intact supraspinatus, infraspinatus, teres minor and
subscapularis tendons. There are focal degenerative changes of the
distal supraspinatus tendon.

Muscles: No atrophy or abnormal signal of the muscles of the rotator
cuff.

Biceps long head:  Properly located and intact.

Acromioclavicular Joint: Minimal AC joint arthropathy. Type 2
acromion. No bursitis.

Glenohumeral Joint: Slight marginal osteophyte formation on the
inferior rim of the glenoid. No effusion. No cartilage defect.

Labrum:  Normal.

Bones:  Small cystic degenerative changes in the greater tuberosity.

Other: None
IMPRESSION: 1. Minimal AC joint arthropathy.
2. Minimal degenerative changes of the inferior aspect of the
glenoid.
3. No other significant findings.  Intact rotator cuff.

## 2019-09-02 ENCOUNTER — Other Ambulatory Visit: Payer: Self-pay | Admitting: Psychiatry

## 2019-09-02 NOTE — Telephone Encounter (Signed)
Still no apt scheduled last visit 12/2018

## 2019-10-14 ENCOUNTER — Ambulatory Visit: Payer: Medicare Other

## 2019-10-26 ENCOUNTER — Other Ambulatory Visit: Payer: Self-pay | Admitting: Internal Medicine

## 2019-10-27 ENCOUNTER — Telehealth: Payer: Self-pay | Admitting: Internal Medicine

## 2019-10-27 MED ORDER — FLUTICASONE-SALMETEROL 500-50 MCG/DOSE IN AEPB: 1.0000 | INHALATION_SPRAY | Freq: Two times a day (BID) | RESPIRATORY_TRACT | 5 refills | Status: DC

## 2019-10-27 NOTE — Telephone Encounter (Signed)
Called and spoke with pt letting her know that I was going to refill her advair inhaler but stated to her that she must keep appt so we could continue to refill meds. Pt verbalized understanding. Nothing further needed.

## 2019-11-18 ENCOUNTER — Ambulatory Visit: Payer: Medicare Other | Admitting: Internal Medicine

## 2019-12-22 ENCOUNTER — Ambulatory Visit: Payer: Medicare Other | Admitting: Internal Medicine

## 2019-12-22 ENCOUNTER — Encounter: Payer: Self-pay | Admitting: Internal Medicine

## 2019-12-22 ENCOUNTER — Other Ambulatory Visit: Payer: Self-pay

## 2019-12-22 VITALS — BP 126/86 | HR 110 | Temp 97.9°F | Ht 67.0 in | Wt 207.0 lb

## 2019-12-22 DIAGNOSIS — K219 Gastro-esophageal reflux disease without esophagitis: Secondary | ICD-10-CM

## 2019-12-22 DIAGNOSIS — J4530 Mild persistent asthma, uncomplicated: Secondary | ICD-10-CM | POA: Diagnosis not present

## 2019-12-22 LAB — CBC WITH DIFFERENTIAL/PLATELET
Basophils Absolute: 0.1 10*3/uL (ref 0.0–0.1)
Basophils Relative: 0.9 % (ref 0.0–3.0)
Eosinophils Absolute: 0.8 10*3/uL — ABNORMAL HIGH (ref 0.0–0.7)
Eosinophils Relative: 7 % — ABNORMAL HIGH (ref 0.0–5.0)
HCT: 37.5 % (ref 36.0–46.0)
Hemoglobin: 12 g/dL (ref 12.0–15.0)
Lymphocytes Relative: 29 % (ref 12.0–46.0)
Lymphs Abs: 3.2 10*3/uL (ref 0.7–4.0)
MCHC: 32 g/dL (ref 30.0–36.0)
MCV: 79.7 fl (ref 78.0–100.0)
Monocytes Absolute: 0.7 10*3/uL (ref 0.1–1.0)
Monocytes Relative: 6.5 % (ref 3.0–12.0)
Neutro Abs: 6.2 10*3/uL (ref 1.4–7.7)
Neutrophils Relative %: 56.6 % (ref 43.0–77.0)
Platelets: 350 10*3/uL (ref 150.0–400.0)
RBC: 4.71 Mil/uL (ref 3.87–5.11)
RDW: 15.9 % — ABNORMAL HIGH (ref 11.5–15.5)
WBC: 10.9 10*3/uL — ABNORMAL HIGH (ref 4.0–10.5)

## 2019-12-22 MED ORDER — BREZTRI AEROSPHERE 160-9-4.8 MCG/ACT IN AERO: 2.0000 | INHALATION_SPRAY | Freq: Two times a day (BID) | RESPIRATORY_TRACT | 0 refills | Status: DC

## 2019-12-22 MED ORDER — MECLIZINE HCL 25 MG PO TABS: ORAL_TABLET | ORAL | 12 refills | Status: DC

## 2019-12-22 MED ORDER — OMEPRAZOLE 20 MG PO CPDR: DELAYED_RELEASE_CAPSULE | ORAL | 4 refills | Status: DC

## 2019-12-22 NOTE — Patient Instructions (Signed)
Order- sample x 2 Breztri   Inhale 2 puffs, exhale through nose, then rinse mouth, twice daily Try this instead of Advair. See if Markus Daft works better.   Order- lab-  CBC w diff, IgE    Dx asthma moderate persistent  Suggest you try elevating the head end of your bed with a brick under each side. See if this reduces reflux at night.   Script sent for prilosec   Please call as needed
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
## 2019-12-23 LAB — IGE: IgE (Immunoglobulin E), Serum: 84 kU/L (ref <=114)

## 2019-12-23 MED ORDER — OMEPRAZOLE 20 MG PO CPDR: DELAYED_RELEASE_CAPSULE | ORAL | 4 refills | Status: DC

## 2019-12-23 MED ORDER — MECLIZINE HCL 25 MG PO TABS: ORAL_TABLET | ORAL | 12 refills | Status: AC

## 2019-12-23 NOTE — Telephone Encounter (Signed)
Ok thanks- [facility]

## 2019-12-23 NOTE — Telephone Encounter (Signed)
Dr. Maple Hudson, please advise. Can we send in the Meclizine 25 mg new order and Omeprazole 20 mg?  Hello  I just learned my insurance will pay for Meclizine 25mg  and Omeprazole 20mg  through my local pharmacy.  Initially I had these sent to [email or web address] but the out of pocket expense is well above [facility].    The Meclizine was written for 12.72m which is the only mg that [facility] carried.  [facility]  [facility].  Could you please call these in to [facility] here in [place] phone [phone].  Quantity for the Meclizine I ask to stay at 60 if possible.  Thank you  [facility] [place]

## 2019-12-23 NOTE — Addendum Note (Signed)
Addended by: Maurene Capes on: 12/23/2019 03:12 PM   Modules accepted: Orders

## 2019-12-23 NOTE — Progress Notes (Signed)
Left vm to return call regarding lab results.

## 2019-12-27 ENCOUNTER — Telehealth: Payer: Self-pay | Admitting: Internal Medicine

## 2019-12-27 NOTE — Telephone Encounter (Signed)
Jetty Duhamel D, MD  12/23/2019 3:09 PM EDT    Lab- IgE antibody is normal. Eosinophil allergy cell count is elevated. If current meds don't control well enough, then we can consider trying a "biologic" injectable therapy as discussed at last visit.    Called and spoke with pt letting her know the results of labwork and she verbalized understanding. Nothing further needed.

## 2019-12-29 ENCOUNTER — Other Ambulatory Visit: Payer: Self-pay | Admitting: Psychiatry

## 2019-12-29 NOTE — Telephone Encounter (Signed)
Has apt 07/29, last apt 12/2018 was due at 6 months

## 2020-01-03 MED ORDER — BREZTRI AEROSPHERE 160-9-4.8 MCG/ACT IN AERO: 2.0000 | INHALATION_SPRAY | Freq: Two times a day (BID) | RESPIRATORY_TRACT | 5 refills | Status: DC

## 2020-02-16 ENCOUNTER — Other Ambulatory Visit: Payer: Self-pay

## 2020-02-16 ENCOUNTER — Encounter: Payer: Self-pay | Admitting: Psychiatry

## 2020-02-16 ENCOUNTER — Ambulatory Visit (INDEPENDENT_AMBULATORY_CARE_PROVIDER_SITE_OTHER): Payer: Medicare Other | Admitting: Psychiatry

## 2020-02-16 DIAGNOSIS — F411 Generalized anxiety disorder: Secondary | ICD-10-CM | POA: Diagnosis not present

## 2020-02-16 DIAGNOSIS — F3340 Major depressive disorder, recurrent, in remission, unspecified: Secondary | ICD-10-CM | POA: Diagnosis not present

## 2020-02-16 DIAGNOSIS — F4001 Agoraphobia with panic disorder: Secondary | ICD-10-CM

## 2020-02-16 MED ORDER — LORAZEPAM 1 MG PO TABS: ORAL_TABLET | ORAL | 5 refills | Status: DC

## 2020-02-16 MED ORDER — DULOXETINE HCL 60 MG PO CPEP: 60.0000 mg | ORAL_CAPSULE | Freq: Two times a day (BID) | ORAL | 3 refills | Status: DC

## 2020-02-16 MED ORDER — DOXAZOSIN MESYLATE 4 MG PO TABS: ORAL_TABLET | ORAL | 1 refills | Status: DC
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
## 2020-02-23 NOTE — Telephone Encounter (Signed)
Dr. Maple Hudson, please advise.  Hello  Natalie Berg is working well for allergies and asthma yet there are days that I am having a lot of sneezing and nasal congestion despite using Nasonex otc or Flonase otc and Claritin otc.  Is there anything prescription wise ex: nasal spray that would be more effective and more efficient than otc. nasal spray and allergy meds.  Thank you

## 2020-02-25 ENCOUNTER — Other Ambulatory Visit: Payer: Self-pay | Admitting: Internal Medicine

## 2020-02-25 MED ORDER — IPRATROPIUM BROMIDE 0.06 % NA SOLN: NASAL | 12 refills | Status: AC

## 2020-02-25 NOTE — Telephone Encounter (Signed)
I have sent script for ipratropium nasal spray to [facility] to try

## 2020-03-10 DIAGNOSIS — K219 Gastro-esophageal reflux disease without esophagitis: Secondary | ICD-10-CM | POA: Insufficient documentation

## 2020-03-10 NOTE — Assessment & Plan Note (Signed)
Continue Prilosec, elevate head of bed, emphasize reflux precautions and discuss w PCP
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
## 2020-03-15 ENCOUNTER — Other Ambulatory Visit: Payer: Self-pay | Admitting: Psychiatry

## 2020-03-16 NOTE — Telephone Encounter (Signed)
fill

## 2020-04-16 ENCOUNTER — Other Ambulatory Visit: Payer: Self-pay | Admitting: Internal Medicine

## 2020-06-11 MED ORDER — BREZTRI AEROSPHERE 160-9-4.8 MCG/ACT IN AERO: 2.0000 | INHALATION_SPRAY | Freq: Two times a day (BID) | RESPIRATORY_TRACT | 0 refills | Status: DC

## 2020-06-11 NOTE — Telephone Encounter (Signed)
Comparable to Breztri :  Trelegy 100  Or  Either Symbicort, or Advair, or Dulera     PLUS  Spiriva (Handihaler or Respimat device)

## 2020-06-11 NOTE — Telephone Encounter (Signed)
Dr. Maple Hudson, Patient is asking for inhalers that are comparable to [facility].  See patient comment and advise.  Thank you.

## 2020-06-12 ENCOUNTER — Other Ambulatory Visit: Payer: Self-pay | Admitting: Psychiatry

## 2020-06-12 DIAGNOSIS — F411 Generalized anxiety disorder: Secondary | ICD-10-CM

## 2020-06-12 DIAGNOSIS — F3340 Major depressive disorder, recurrent, in remission, unspecified: Secondary | ICD-10-CM

## 2020-06-12 NOTE — Telephone Encounter (Signed)
Dr. Maple Hudson please advise on patient requesting refills   [name] [facility] for [phone](my regular pharmacy)   generic Proventil-Albuterol Sulfate HFA (Generic) please call this in as well   generic Prilosec 20 tid quantity 90-this would be new as 60 were prescribed before-I will come in to f/u as soon as I can-I have to travel quite a bit and with the holidays it will need to be after.      Ok so [facility] has to be used for this particular medications because [facility] is not a preferred [facility] for Spiriva. Sorry about having to use 2 pharmacies   [facility] [phone]  Pharmacy Store [phone]  [phone]  Overview  Locations  [address]  [place], [place] [address]   [name]  Open until 9:00pm   View Hours  Phone  [phone]   Spiriva Respimat is fine-looking at my insurance this is how it is displayed-  Spiriva Respimat (Brand)  Form: Aerosol SolutionStrength: 2.5 MCG/ACTPackage size: 4.00 GM Inhaler    I will make an appointment to come in after the holidays as with traveling too I would not be able to come in until then.   Thank you   [name]

## 2020-06-12 NOTE — Telephone Encounter (Signed)
Ok to refill these meds for 12 months

## 2020-06-13 ENCOUNTER — Other Ambulatory Visit: Payer: Self-pay

## 2020-06-13 MED ORDER — OMEPRAZOLE 20 MG PO CPDR
20.0000 mg | DELAYED_RELEASE_CAPSULE | Freq: Three times a day (TID) | ORAL | 11 refills | Status: DC
Start: 2020-06-13 — End: 2021-04-26

## 2020-06-13 MED ORDER — MOMETASONE FURO-FORMOTEROL FUM 100-5 MCG/ACT IN AERO
2.0000 | INHALATION_SPRAY | Freq: Two times a day (BID) | RESPIRATORY_TRACT | 11 refills | Status: DC
Start: 2020-06-13 — End: 2021-09-06

## 2020-06-13 MED ORDER — ALBUTEROL SULFATE HFA 108 (90 BASE) MCG/ACT IN AERS: 2.0000 | INHALATION_SPRAY | Freq: Four times a day (QID) | RESPIRATORY_TRACT | 11 refills | Status: DC | PRN

## 2020-06-13 MED ORDER — SPIRIVA RESPIMAT 2.5 MCG/ACT IN AERS: 2.0000 | INHALATION_SPRAY | Freq: Every day | RESPIRATORY_TRACT | 11 refills | Status: DC

## 2020-06-13 NOTE — Addendum Note (Signed)
Addended by: Wyvonne Lenz on: 06/13/2020 10:26 AM   Modules accepted: Orders

## 2020-07-02 NOTE — Telephone Encounter (Signed)
Only if we have "way plenty" ok to give 4. Thanks .

## 2020-07-02 NOTE — Telephone Encounter (Signed)
Dr. Maple Hudson the patient sent email this morning  I am writing to ask if I may have at least one more sample of Breztri.  My insurance requires me to pay 158 dollars for the prescription.  I'm in the gap stage and I cannot afford to pay that for one prescription.  If you can help me, the issue should be resolved when I start with the 3 separate meds July 21 2020.  Right now, the sum of those 3 meds is more than the Breztri, which I also cannot afford.  I apologize I try to not ask as I know there are others in need.  Please advise on giving patient samples.   Allergies  Allergen Reactions  . Bee Venom Swelling    Has epi pen  . Adhesive [Tape] Rash  . Latex Rash    itching   Current Outpatient Medications on File Prior to Visit  Medication Sig Dispense Refill  . acetaminophen (TYLENOL) 500 MG tablet Take 500 mg by mouth every 6 (six) hours as needed. For pain    . acetaminophen-codeine (TYLENOL #4) 300-60 MG tablet Take 1 tablet by mouth every 4 (four) hours as needed for moderate pain. 30 tablet 0  . albuterol (PROVENTIL HFA) 108 (90 Base) MCG/ACT inhaler Inhale 2 puffs into the lungs every 6 (six) hours as needed for wheezing or shortness of breath. Inhale 2 puffs every 4-6 hours prn 8 g 11  . cyclobenzaprine (FLEXERIL) 10 MG tablet Take 1 tablet (10 mg total) by mouth 3 (three) times daily as needed. For muscle spasms 30 tablet 1  . diclofenac sodium (VOLTAREN) 1 % GEL Apply 1 application topically daily.    Marland Kitchen doxazosin (CARDURA) 4 MG tablet 1/2 TABLET AT NIGHT FOR 1 WEEK THEN 1 AT NIGHT 30 tablet 5  . DULoxetine (CYMBALTA) 60 MG capsule Take 1 capsule by mouth twice a day 240 capsule 0  . EPINEPHrine 0.3 mg/0.3 mL IJ SOAJ injection Inject 0.3 mLs (0.3 mg total) into the muscle once. 1 Device prn  . fluticasone (FLONASE) 50 MCG/ACT nasal spray Place 2 sprays into both nostrils daily as needed. For allergies 16 g prn  . ipratropium (ATROVENT) 0.06 % nasal spray 1-2 puffs each nostril  three times daily as needed 15 mL 12  . loratadine (CLARITIN) 10 MG tablet Take 10 mg by mouth daily as needed. For allergies    . LORazepam (ATIVAN) 1 MG tablet TAKE 1-2 TABLETS BY MOUTH 4 TIMES A DAY AS NEEDED 120 tablet 5  . meclizine (ANTIVERT) 25 MG tablet 2 tabs twice daily if needed 60 tablet 12  . meloxicam (MOBIC) 15 MG tablet Take 1 tablet by mouth every day 100 tablet 2  . mirtazapine (REMERON) 7.5 MG tablet Take 7.5 mg by mouth at bedtime. (Patient not taking: Reported on 02/16/2020)    . mometasone-formoterol (DULERA) 100-5 MCG/ACT AERO Inhale 2 puffs into the lungs in the morning and at bedtime. 13 g 11  . montelukast (SINGULAIR) 10 MG tablet Take 1 tablet (10 mg total) by mouth at bedtime. (Patient not taking: Reported on 02/16/2020) 30 tablet 11  . omeprazole (PRILOSEC) 20 MG capsule Take 1 capsule (20 mg total) by mouth in the morning, at noon, and at bedtime. Up to 3 tabs daily before meals 90 capsule 11  . Suvorexant (BELSOMRA) 20 MG TABS Take 1 tablet by mouth as needed.     . Tiotropium Bromide Monohydrate (SPIRIVA RESPIMAT) 2.5 MCG/ACT AERS Inhale 2 puffs into  the lungs daily. 4 g 11  . Vitamin D, Ergocalciferol, (DRISDOL) 50000 units CAPS capsule Take 1 capsule (50,000 Units total) by mouth every 14 (fourteen) days. 6 capsule 1   No current facility-administered medications on file prior to visit.

## 2020-07-02 NOTE — Telephone Encounter (Signed)
Yes we have enough to give her the rest of the month but I can give her 2 or I can give her 4

## 2020-07-02 NOTE — Telephone Encounter (Signed)
Do we have enough samples that we can give her two, with suggestion she use just 2 puffs, ONCE daily, until January ??

## 2020-07-06 MED ORDER — BREZTRI AEROSPHERE 160-9-4.8 MCG/ACT IN AERO: 2.0000 | INHALATION_SPRAY | Freq: Two times a day (BID) | RESPIRATORY_TRACT | 0 refills | Status: DC

## 2020-07-06 NOTE — Telephone Encounter (Signed)
Samples have been placed up front for pt.Natalie Berg message sent to pt making her aware this had been done. Nothing further needed.

## 2020-07-06 NOTE — Addendum Note (Signed)
Addended by: Wyvonne Lenz on: 07/06/2020 11:12 AM   Modules accepted: Orders

## 2020-08-30 ENCOUNTER — Other Ambulatory Visit: Payer: Self-pay | Admitting: Psychiatry

## 2020-08-30 DIAGNOSIS — F4001 Agoraphobia with panic disorder: Secondary | ICD-10-CM

## 2020-09-26 ENCOUNTER — Other Ambulatory Visit: Payer: Self-pay | Admitting: Radiology

## 2020-09-26 MED ORDER — MELOXICAM 15 MG PO TABS
15.0000 mg | ORAL_TABLET | Freq: Every day | ORAL | 2 refills | Status: DC
Start: 2020-09-26 — End: 2021-05-24

## 2020-10-22 ENCOUNTER — Other Ambulatory Visit: Payer: Self-pay

## 2020-10-22 DIAGNOSIS — F3340 Major depressive disorder, recurrent, in remission, unspecified: Secondary | ICD-10-CM

## 2020-10-22 DIAGNOSIS — F411 Generalized anxiety disorder: Secondary | ICD-10-CM

## 2020-10-22 MED ORDER — DULOXETINE HCL 60 MG PO CPEP: 60.0000 mg | ORAL_CAPSULE | Freq: Two times a day (BID) | ORAL | 0 refills | Status: DC

## 2020-11-01 ENCOUNTER — Emergency Department (HOSPITAL_COMMUNITY): Payer: Medicare Other

## 2020-11-01 ENCOUNTER — Other Ambulatory Visit: Payer: Self-pay

## 2020-11-01 ENCOUNTER — Encounter (HOSPITAL_COMMUNITY): Payer: Self-pay

## 2020-11-01 ENCOUNTER — Inpatient Hospital Stay (HOSPITAL_COMMUNITY)
Admission: EM | Admit: 2020-11-01 | Discharge: 2020-11-05 | DRG: 494 | Disposition: A | Discharge status: Home / Self Care | Payer: Medicare Other | Attending: Orthopedic Surgery | Admitting: Orthopedic Surgery

## 2020-11-01 DIAGNOSIS — G629 Polyneuropathy, unspecified: Secondary | ICD-10-CM | POA: Diagnosis present

## 2020-11-01 DIAGNOSIS — G8929 Other chronic pain: Secondary | ICD-10-CM | POA: Diagnosis present

## 2020-11-01 DIAGNOSIS — Z79899 Other long term (current) drug therapy: Secondary | ICD-10-CM | POA: Diagnosis not present

## 2020-11-01 DIAGNOSIS — Z91030 Bee allergy status: Secondary | ICD-10-CM

## 2020-11-01 DIAGNOSIS — R519 Headache, unspecified: Secondary | ICD-10-CM | POA: Diagnosis present

## 2020-11-01 DIAGNOSIS — Z841 Family history of disorders of kidney and ureter: Secondary | ICD-10-CM | POA: Diagnosis not present

## 2020-11-01 DIAGNOSIS — X58XXXA Exposure to other specified factors, initial encounter: Secondary | ICD-10-CM | POA: Diagnosis present

## 2020-11-01 DIAGNOSIS — M549 Dorsalgia, unspecified: Secondary | ICD-10-CM | POA: Diagnosis present

## 2020-11-01 DIAGNOSIS — K219 Gastro-esophageal reflux disease without esophagitis: Secondary | ICD-10-CM | POA: Diagnosis present

## 2020-11-01 DIAGNOSIS — M810 Age-related osteoporosis without current pathological fracture: Secondary | ICD-10-CM | POA: Diagnosis present

## 2020-11-01 DIAGNOSIS — S82102A Unspecified fracture of upper end of left tibia, initial encounter for closed fracture: Secondary | ICD-10-CM | POA: Diagnosis present

## 2020-11-01 DIAGNOSIS — Z8249 Family history of ischemic heart disease and other diseases of the circulatory system: Secondary | ICD-10-CM | POA: Diagnosis not present

## 2020-11-01 DIAGNOSIS — S82143A Displaced bicondylar fracture of unspecified tibia, initial encounter for closed fracture: Secondary | ICD-10-CM | POA: Diagnosis present

## 2020-11-01 DIAGNOSIS — Z833 Family history of diabetes mellitus: Secondary | ICD-10-CM | POA: Diagnosis not present

## 2020-11-01 DIAGNOSIS — Z825 Family history of asthma and other chronic lower respiratory diseases: Secondary | ICD-10-CM

## 2020-11-01 DIAGNOSIS — T148XXA Other injury of unspecified body region, initial encounter: Secondary | ICD-10-CM

## 2020-11-01 DIAGNOSIS — Y92009 Unspecified place in unspecified non-institutional (private) residence as the place of occurrence of the external cause: Secondary | ICD-10-CM

## 2020-11-01 DIAGNOSIS — Z419 Encounter for procedure for purposes other than remedying health state, unspecified: Secondary | ICD-10-CM

## 2020-11-01 DIAGNOSIS — Z91040 Latex allergy status: Secondary | ICD-10-CM | POA: Diagnosis not present

## 2020-11-01 DIAGNOSIS — F419 Anxiety disorder, unspecified: Secondary | ICD-10-CM | POA: Diagnosis present

## 2020-11-01 DIAGNOSIS — Z91048 Other nonmedicinal substance allergy status: Secondary | ICD-10-CM | POA: Diagnosis not present

## 2020-11-01 DIAGNOSIS — J45909 Unspecified asthma, uncomplicated: Secondary | ICD-10-CM | POA: Diagnosis present

## 2020-11-01 DIAGNOSIS — Z20822 Contact with and (suspected) exposure to covid-19: Secondary | ICD-10-CM | POA: Diagnosis present

## 2020-11-01 DIAGNOSIS — F4001 Agoraphobia with panic disorder: Secondary | ICD-10-CM

## 2020-11-01 DIAGNOSIS — S82142A Displaced bicondylar fracture of left tibia, initial encounter for closed fracture: Secondary | ICD-10-CM | POA: Diagnosis present

## 2020-11-01 DIAGNOSIS — F32A Depression, unspecified: Secondary | ICD-10-CM | POA: Diagnosis present

## 2020-11-01 MED ORDER — METHOCARBAMOL 500 MG PO TABS
500.0000 mg | ORAL_TABLET | Freq: Four times a day (QID) | ORAL | Status: DC | PRN
  Administered 2020-11-02 (×2): 500 mg via ORAL
  Filled 2020-11-01 (×2): qty 1

## 2020-11-01 MED ORDER — ONDANSETRON HCL 4 MG/2ML IJ SOLN
4.0000 mg | Freq: Once | INTRAMUSCULAR | Status: AC
  Administered 2020-11-02: 4 mg via INTRAVENOUS
  Filled 2020-11-01: qty 2

## 2020-11-01 MED ORDER — HYDROCODONE-ACETAMINOPHEN 5-325 MG PO TABS
1.0000 | ORAL_TABLET | ORAL | Status: DC | PRN
  Administered 2020-11-02 (×2): 1 via ORAL
  Filled 2020-11-01 (×2): qty 1

## 2020-11-01 MED ORDER — SODIUM CHLORIDE 0.9 % IV BOLUS
500.0000 mL | Freq: Once | INTRAVENOUS | Status: AC
  Administered 2020-11-02: 500 mL via INTRAVENOUS

## 2020-11-01 MED ORDER — TIOTROPIUM BROMIDE MONOHYDRATE 18 MCG IN CAPS: 18.0000 ug | ORAL_CAPSULE | Freq: Every day | RESPIRATORY_TRACT | Status: DC

## 2020-11-01 MED ORDER — IPRATROPIUM BROMIDE 0.06 % NA SOLN
2.0000 | Freq: Three times a day (TID) | NASAL | Status: DC | PRN
  Filled 2020-11-01: qty 15

## 2020-11-01 MED ORDER — DOCUSATE SODIUM 100 MG PO CAPS
100.0000 mg | ORAL_CAPSULE | Freq: Two times a day (BID) | ORAL | Status: DC
  Administered 2020-11-02 (×2): 100 mg via ORAL
  Filled 2020-11-01 (×3): qty 1

## 2020-11-01 MED ORDER — METHOCARBAMOL 1000 MG/10ML IJ SOLN
500.0000 mg | Freq: Four times a day (QID) | INTRAVENOUS | Status: DC | PRN
  Filled 2020-11-01: qty 5

## 2020-11-01 MED ORDER — ACETAMINOPHEN 325 MG PO TABS: 325.0000 mg | ORAL_TABLET | Freq: Four times a day (QID) | ORAL | Status: DC | PRN

## 2020-11-01 MED ORDER — DULOXETINE HCL 60 MG PO CPEP
60.0000 mg | ORAL_CAPSULE | Freq: Two times a day (BID) | ORAL | Status: DC
  Administered 2020-11-02 (×3): 60 mg via ORAL
  Filled 2020-11-01 (×2): qty 1
  Filled 2020-11-01: qty 2

## 2020-11-01 MED ORDER — OXYCODONE-ACETAMINOPHEN 5-325 MG PO TABS
1.0000 | ORAL_TABLET | Freq: Once | ORAL | Status: AC
  Administered 2020-11-01: 1 via ORAL
  Filled 2020-11-01: qty 1

## 2020-11-01 MED ORDER — FENTANYL CITRATE (PF) 100 MCG/2ML IJ SOLN
50.0000 ug | Freq: Once | INTRAMUSCULAR | Status: AC
Start: 2020-11-01 — End: 2020-11-02
  Administered 2020-11-02: 50 ug via INTRAVENOUS
  Filled 2020-11-01: qty 2

## 2020-11-01 MED ORDER — MOMETASONE FURO-FORMOTEROL FUM 100-5 MCG/ACT IN AERO
2.0000 | INHALATION_SPRAY | Freq: Two times a day (BID) | RESPIRATORY_TRACT | Status: DC
  Filled 2020-11-01: qty 8.8

## 2020-11-01 MED ORDER — LACTATED RINGERS IV SOLN: INTRAVENOUS | Status: DC

## 2020-11-01 MED ORDER — MORPHINE SULFATE (PF) 2 MG/ML IV SOLN
0.5000 mg | INTRAVENOUS | Status: DC | PRN
  Administered 2020-11-02: 0.5 mg via INTRAVENOUS
  Administered 2020-11-02: 1 mg via INTRAVENOUS
  Filled 2020-11-01 (×2): qty 1

## 2020-11-01 MED ORDER — ALBUTEROL SULFATE HFA 108 (90 BASE) MCG/ACT IN AERS
2.0000 | INHALATION_SPRAY | Freq: Four times a day (QID) | RESPIRATORY_TRACT | Status: DC | PRN
  Filled 2020-11-01: qty 6.7

## 2020-11-01 MED ORDER — HYDROCODONE-ACETAMINOPHEN 7.5-325 MG PO TABS
1.0000 | ORAL_TABLET | ORAL | Status: DC | PRN
  Administered 2020-11-02: 2 via ORAL
  Filled 2020-11-01: qty 2

## 2020-11-01 MED ORDER — ACETAMINOPHEN 500 MG PO TABS
500.0000 mg | ORAL_TABLET | Freq: Four times a day (QID) | ORAL | Status: AC
  Administered 2020-11-02 (×4): 500 mg via ORAL
  Filled 2020-11-01 (×4): qty 1

## 2020-11-01 MED ORDER — POLYETHYLENE GLYCOL 3350 17 G PO PACK: 17.0000 g | PACK | Freq: Every day | ORAL | Status: DC | PRN
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
## 2020-11-02 ENCOUNTER — Encounter (HOSPITAL_COMMUNITY): Payer: Self-pay | Admitting: Orthopedic Surgery

## 2020-11-02 ENCOUNTER — Inpatient Hospital Stay (HOSPITAL_COMMUNITY): Payer: Medicare Other

## 2020-11-02 ENCOUNTER — Other Ambulatory Visit: Payer: Self-pay

## 2020-11-02 LAB — CBC WITH DIFFERENTIAL/PLATELET
Abs Immature Granulocytes: 0.05 10*3/uL (ref 0.00–0.07)
Basophils Absolute: 0.1 10*3/uL (ref 0.0–0.1)
Basophils Relative: 1 %
Eosinophils Absolute: 0 10*3/uL (ref 0.0–0.5)
Eosinophils Relative: 0 %
HCT: 40 % (ref 36.0–46.0)
Hemoglobin: 12.5 g/dL (ref 12.0–15.0)
Immature Granulocytes: 0 %
Lymphocytes Relative: 15 %
Lymphs Abs: 2.2 10*3/uL (ref 0.7–4.0)
MCH: 24.7 pg — ABNORMAL LOW (ref 26.0–34.0)
MCHC: 31.3 g/dL (ref 30.0–36.0)
MCV: 78.9 fL — ABNORMAL LOW (ref 80.0–100.0)
Monocytes Absolute: 0.6 10*3/uL (ref 0.1–1.0)
Monocytes Relative: 4 %
Neutro Abs: 11.9 10*3/uL — ABNORMAL HIGH (ref 1.7–7.7)
Neutrophils Relative %: 80 %
Platelets: 405 10*3/uL — ABNORMAL HIGH (ref 150–400)
RBC: 5.07 MIL/uL (ref 3.87–5.11)
RDW: 16.5 % — ABNORMAL HIGH (ref 11.5–15.5)
WBC: 14.8 10*3/uL — ABNORMAL HIGH (ref 4.0–10.5)
nRBC: 0 % (ref 0.0–0.2)

## 2020-11-02 LAB — HIV ANTIBODY (ROUTINE TESTING W REFLEX): HIV Screen 4th Generation wRfx: NONREACTIVE

## 2020-11-02 LAB — BASIC METABOLIC PANEL
Anion gap: 9 (ref 5–15)
BUN: 12 mg/dL (ref 6–20)
CO2: 24 mmol/L (ref 22–32)
Calcium: 9.6 mg/dL (ref 8.9–10.3)
Chloride: 102 mmol/L (ref 98–111)
Creatinine, Ser: 0.77 mg/dL (ref 0.44–1.00)
GFR, Estimated: >60 mL/min (ref >60)
Glucose, Bld: 134 mg/dL — ABNORMAL HIGH (ref 70–99)
Potassium: 3.7 mmol/L (ref 3.5–5.1)
Sodium: 135 mmol/L (ref 135–145)

## 2020-11-02 LAB — RESP PANEL BY RT-PCR (FLU A&B, COVID) ARPGX2
Influenza A by PCR: NEGATIVE
Influenza B by PCR: NEGATIVE
SARS Coronavirus 2 by RT PCR: NEGATIVE

## 2020-11-02 MED ORDER — PANTOPRAZOLE SODIUM 40 MG PO TBEC
40.0000 mg | DELAYED_RELEASE_TABLET | Freq: Two times a day (BID) | ORAL | Status: DC
  Administered 2020-11-02: 40 mg via ORAL
  Filled 2020-11-02 (×2): qty 1

## 2020-11-02 MED ORDER — LORAZEPAM 1 MG PO TABS
1.0000 mg | ORAL_TABLET | Freq: Three times a day (TID) | ORAL | Status: DC | PRN
  Administered 2020-11-02: 2 mg via ORAL
  Filled 2020-11-02: qty 2

## 2020-11-02 MED ORDER — LORAZEPAM 1 MG PO TABS: 2.0000 mg | ORAL_TABLET | Freq: Three times a day (TID) | ORAL | Status: DC

## 2020-11-02 MED ORDER — UMECLIDINIUM BROMIDE 62.5 MCG/INH IN AEPB
1.0000 | INHALATION_SPRAY | Freq: Every day | RESPIRATORY_TRACT | Status: DC
  Filled 2020-11-02: qty 7

## 2020-11-02 NOTE — Progress Notes (Signed)
Patient transported from [facility] via [place]; handoff complete. Received patient awake,alert/orientedx4 and able to verbalize needs. VSS (see flowsheets). NAD noted; respirations easy/even on room air. Movement/ sensation to all extremities noted. Immobilizer to LLE in place. Oriented to room and floor and educated provided on how to use the call light/ call RN. All safety measures in place and personal belongings within reach.

## 2020-11-02 NOTE — ED Notes (Signed)
Nurse called lab to check on status of covid swab for admission.

## 2020-11-02 NOTE — Progress Notes (Signed)
    Subjective: Patient reports pain as 5 on 0-10 scale.   Denies CP or SOB.  Voiding without difficulty. Positive flatus. Objective: Vital signs in last 24 hours: Temp:  [98.4 F (36.9 C)-98.9 F (37.2 C)] 98.9 F (37.2 C) (04/15 1301) Pulse Rate:  [80-104] 84 (04/15 1301) Resp:  [16-26] 16 (04/15 1301) BP: (125-184)/(69-118) 125/69 (04/15 1301) SpO2:  [92 %-100 %] 92 % (04/15 1301) Weight:  [97.5 kg] 97.5 kg (04/14 1933)  Intake/Output from previous day: No intake/output data recorded. Intake/Output this shift: Total I/O In: 747.2 [I.V.:747.2] Out: 200 [Urine:200]  Labs: Recent Labs    11/02/20 0002  HGB 12.5   Recent Labs    11/02/20 0002  WBC 14.8*  RBC 5.07  HCT 40.0  PLT 405*   Recent Labs    11/02/20 0002  NA 135  K 3.7  CL 102  CO2 24  BUN 12  CREATININE 0.77  GLUCOSE 134*  CALCIUM 9.6   No results for input(s): LABPT, INR in the last 72 hours.  Physical Exam: Neurologically intact ABD soft Intact pulses distally Dorsiflexion/Plantar flexion intact Compartment soft Body mass index is 33.67 kg/m.   Assessment/Plan: Patient currently stable Spoke with Dr Carola Frost - plan on application of ex-fix as temporary fixation. Will require definitive ORIF in the near future.   Patient aware of plan and in agreement Discussed risks of surgery: death, stroke, loss of fixation, malunion or non union of fracture, nerve damage, blood clots, infection, need for additional surgery. NPO after midnight with plan on ex-fix of  Left LE Saturday AM  Alvy Beal for Dr. Venita Lick [facility] [phone] 11/02/2020, 5:37 PM

## 2020-11-02 NOTE — ED Notes (Signed)
Called [facility] and called report to [facility].

## 2020-11-02 NOTE — Plan of Care (Signed)

## 2020-11-03 ENCOUNTER — Encounter (HOSPITAL_COMMUNITY): Payer: Self-pay | Admitting: Orthopedic Surgery

## 2020-11-03 ENCOUNTER — Inpatient Hospital Stay (HOSPITAL_COMMUNITY): Payer: Medicare Other

## 2020-11-03 ENCOUNTER — Inpatient Hospital Stay (HOSPITAL_COMMUNITY): Payer: Medicare Other | Admitting: Certified Registered Nurse Anesthetist

## 2020-11-03 ENCOUNTER — Encounter (HOSPITAL_COMMUNITY)
Admission: EM | Disposition: A | Discharge status: Home / Self Care | Payer: Self-pay | Source: Home / Self Care | Attending: Orthopedic Surgery

## 2020-11-03 HISTORY — PX: EXTERNAL FIXATION LEG: SHX1549

## 2020-11-03 LAB — URINALYSIS, ROUTINE W REFLEX MICROSCOPIC
Bilirubin Urine: NEGATIVE
Glucose, UA: NEGATIVE mg/dL
Hgb urine dipstick: NEGATIVE
Ketones, ur: NEGATIVE mg/dL
Leukocytes,Ua: NEGATIVE
Nitrite: NEGATIVE
Protein, ur: NEGATIVE mg/dL
Specific Gravity, Urine: 1.028 (ref 1.005–1.030)
pH: 5 (ref 5.0–8.0)

## 2020-11-03 LAB — SURGICAL PCR SCREEN
MRSA, PCR: NEGATIVE
Staphylococcus aureus: POSITIVE — AB

## 2020-11-03 SURGERY — EXTERNAL FIXATION, LOWER EXTREMITY
Anesthesia: General | Laterality: Left

## 2020-11-03 MED ORDER — 0.9 % SODIUM CHLORIDE (POUR BTL) OPTIME
TOPICAL | Status: DC | PRN
  Administered 2020-11-03: 1000 mL

## 2020-11-03 MED ORDER — KETOROLAC TROMETHAMINE 30 MG/ML IJ SOLN
INTRAMUSCULAR | Status: DC | PRN
  Administered 2020-11-03: 30 mg via INTRAVENOUS

## 2020-11-03 MED ORDER — CEFAZOLIN SODIUM-DEXTROSE 2-4 GM/100ML-% IV SOLN
2.0000 g | Freq: Four times a day (QID) | INTRAVENOUS | Status: AC
  Administered 2020-11-03 – 2020-11-04 (×3): 2 g via INTRAVENOUS
  Filled 2020-11-03 (×3): qty 100

## 2020-11-03 MED ORDER — METHOCARBAMOL 500 MG PO TABS: 500.0000 mg | ORAL_TABLET | Freq: Three times a day (TID) | ORAL | 0 refills | Status: AC | PRN

## 2020-11-03 MED ORDER — ACETAMINOPHEN 500 MG PO TABS
500.0000 mg | ORAL_TABLET | Freq: Four times a day (QID) | ORAL | Status: AC
  Administered 2020-11-03 – 2020-11-04 (×4): 500 mg via ORAL
  Filled 2020-11-03 (×4): qty 1

## 2020-11-03 MED ORDER — ACETAMINOPHEN 10 MG/ML IV SOLN
INTRAVENOUS | Status: DC | PRN
  Administered 2020-11-03: 1000 mg via INTRAVENOUS

## 2020-11-03 MED ORDER — ASPIRIN EC 81 MG PO TBEC: 81.0000 mg | DELAYED_RELEASE_TABLET | Freq: Every day | ORAL | 2 refills | Status: DC

## 2020-11-03 MED ORDER — METHOCARBAMOL 1000 MG/10ML IJ SOLN
500.0000 mg | Freq: Four times a day (QID) | INTRAVENOUS | Status: DC | PRN
  Filled 2020-11-03: qty 5

## 2020-11-03 MED ORDER — FLUTICASONE PROPIONATE 50 MCG/ACT NA SUSP
2.0000 | Freq: Every day | NASAL | Status: DC | PRN
  Filled 2020-11-03: qty 16

## 2020-11-03 MED ORDER — ALBUTEROL SULFATE HFA 108 (90 BASE) MCG/ACT IN AERS
2.0000 | INHALATION_SPRAY | Freq: Four times a day (QID) | RESPIRATORY_TRACT | Status: DC | PRN
  Filled 2020-11-03: qty 6.7

## 2020-11-03 MED ORDER — ONDANSETRON HCL 4 MG PO TABS: 4.0000 mg | ORAL_TABLET | Freq: Four times a day (QID) | ORAL | Status: DC | PRN

## 2020-11-03 MED ORDER — METOCLOPRAMIDE HCL 5 MG PO TABS: 5.0000 mg | ORAL_TABLET | Freq: Three times a day (TID) | ORAL | Status: DC | PRN

## 2020-11-03 MED ORDER — HYDROMORPHONE HCL 1 MG/ML IJ SOLN
0.2500 mg | INTRAMUSCULAR | Status: DC | PRN
Start: 2020-11-03 — End: 2020-11-03

## 2020-11-03 MED ORDER — ACETAMINOPHEN 325 MG PO TABS: 325.0000 mg | ORAL_TABLET | Freq: Four times a day (QID) | ORAL | Status: DC | PRN

## 2020-11-03 MED ORDER — FENTANYL CITRATE (PF) 250 MCG/5ML IJ SOLN
INTRAMUSCULAR | Status: DC | PRN
  Administered 2020-11-03: 50 ug via INTRAVENOUS
  Administered 2020-11-03 (×8): 25 ug via INTRAVENOUS

## 2020-11-03 MED ORDER — ACETAMINOPHEN 10 MG/ML IV SOLN
INTRAVENOUS | Status: AC
  Filled 2020-11-03: qty 100

## 2020-11-03 MED ORDER — PROPOFOL 10 MG/ML IV BOLUS
INTRAVENOUS | Status: DC | PRN
  Administered 2020-11-03: 150 mg via INTRAVENOUS

## 2020-11-03 MED ORDER — MUPIROCIN 2 % EX OINT: TOPICAL_OINTMENT | Freq: Two times a day (BID) | CUTANEOUS | Status: DC

## 2020-11-03 MED ORDER — METOCLOPRAMIDE HCL 5 MG/ML IJ SOLN
5.0000 mg | Freq: Three times a day (TID) | INTRAMUSCULAR | Status: DC | PRN
Start: 2020-11-03 — End: 2020-11-05
  Filled 2020-11-03: qty 2

## 2020-11-03 MED ORDER — OXYCODONE-ACETAMINOPHEN 10-325 MG PO TABS: 1.0000 | ORAL_TABLET | Freq: Four times a day (QID) | ORAL | 0 refills | Status: AC | PRN

## 2020-11-03 MED ORDER — MIDAZOLAM HCL 2 MG/2ML IJ SOLN
INTRAMUSCULAR | Status: DC | PRN
  Administered 2020-11-03: 2 mg via INTRAVENOUS

## 2020-11-03 MED ORDER — ONDANSETRON HCL 4 MG PO TABS: 4.0000 mg | ORAL_TABLET | Freq: Three times a day (TID) | ORAL | 0 refills | Status: DC | PRN

## 2020-11-03 MED ORDER — ONDANSETRON HCL 4 MG/2ML IJ SOLN
INTRAMUSCULAR | Status: DC | PRN
  Administered 2020-11-03: 4 mg via INTRAVENOUS

## 2020-11-03 MED ORDER — CEFAZOLIN SODIUM-DEXTROSE 2-4 GM/100ML-% IV SOLN
INTRAVENOUS | Status: AC
  Filled 2020-11-03: qty 100

## 2020-11-03 MED ORDER — MIDAZOLAM HCL 2 MG/2ML IJ SOLN
INTRAMUSCULAR | Status: AC
  Filled 2020-11-03: qty 2

## 2020-11-03 MED ORDER — HYDROCODONE-ACETAMINOPHEN 7.5-325 MG PO TABS
1.0000 | ORAL_TABLET | ORAL | Status: DC | PRN
  Administered 2020-11-04: 2 via ORAL
  Filled 2020-11-03: qty 2

## 2020-11-03 MED ORDER — CEFAZOLIN SODIUM-DEXTROSE 2-4 GM/100ML-% IV SOLN
2.0000 g | INTRAVENOUS | Status: AC
  Administered 2020-11-03: 2 g via INTRAVENOUS

## 2020-11-03 MED ORDER — IPRATROPIUM BROMIDE 0.06 % NA SOLN
1.0000 | Freq: Three times a day (TID) | NASAL | Status: DC | PRN
Start: 2020-11-03 — End: 2020-11-05
  Filled 2020-11-03: qty 15

## 2020-11-03 MED ORDER — DEXAMETHASONE SODIUM PHOSPHATE 10 MG/ML IJ SOLN
INTRAMUSCULAR | Status: AC
  Filled 2020-11-03: qty 1

## 2020-11-03 MED ORDER — LIDOCAINE 2% (20 MG/ML) 5 ML SYRINGE
INTRAMUSCULAR | Status: AC
  Filled 2020-11-03: qty 5

## 2020-11-03 MED ORDER — FENTANYL CITRATE (PF) 250 MCG/5ML IJ SOLN
INTRAMUSCULAR | Status: AC
  Filled 2020-11-03: qty 5

## 2020-11-03 MED ORDER — DEXAMETHASONE SODIUM PHOSPHATE 10 MG/ML IJ SOLN
INTRAMUSCULAR | Status: DC | PRN
  Administered 2020-11-03: 10 mg via INTRAVENOUS

## 2020-11-03 MED ORDER — MORPHINE SULFATE (PF) 2 MG/ML IV SOLN
0.5000 mg | INTRAVENOUS | Status: DC | PRN
  Administered 2020-11-04: 1 mg via INTRAVENOUS
  Filled 2020-11-03: qty 1

## 2020-11-03 MED ORDER — KETOROLAC TROMETHAMINE 30 MG/ML IJ SOLN
INTRAMUSCULAR | Status: AC
  Filled 2020-11-03: qty 1

## 2020-11-03 MED ORDER — MOMETASONE FURO-FORMOTEROL FUM 100-5 MCG/ACT IN AERO
2.0000 | INHALATION_SPRAY | Freq: Two times a day (BID) | RESPIRATORY_TRACT | Status: DC
  Administered 2020-11-04 – 2020-11-05 (×2): 2 via RESPIRATORY_TRACT
  Filled 2020-11-03: qty 8.8

## 2020-11-03 MED ORDER — DULOXETINE HCL 60 MG PO CPEP
60.0000 mg | ORAL_CAPSULE | Freq: Two times a day (BID) | ORAL | Status: DC
  Administered 2020-11-03 – 2020-11-05 (×5): 60 mg via ORAL
  Filled 2020-11-03 (×5): qty 1

## 2020-11-03 MED ORDER — ONDANSETRON HCL 4 MG/2ML IJ SOLN: 4.0000 mg | Freq: Four times a day (QID) | INTRAMUSCULAR | Status: DC | PRN

## 2020-11-03 MED ORDER — LACTATED RINGERS IV SOLN: INTRAVENOUS | Status: DC

## 2020-11-03 MED ORDER — CHLORHEXIDINE GLUCONATE 0.12 % MT SOLN
OROMUCOSAL | Status: AC
  Administered 2020-11-03: 15 mL
  Filled 2020-11-03: qty 15

## 2020-11-03 MED ORDER — PANTOPRAZOLE SODIUM 40 MG PO TBEC
40.0000 mg | DELAYED_RELEASE_TABLET | Freq: Every day | ORAL | Status: DC
  Administered 2020-11-03 – 2020-11-05 (×3): 40 mg via ORAL
  Filled 2020-11-03 (×3): qty 1

## 2020-11-03 MED ORDER — PROPOFOL 10 MG/ML IV BOLUS
INTRAVENOUS | Status: AC
  Filled 2020-11-03: qty 20

## 2020-11-03 MED ORDER — LIDOCAINE 2% (20 MG/ML) 5 ML SYRINGE
INTRAMUSCULAR | Status: DC | PRN
  Administered 2020-11-03: 60 mg via INTRAVENOUS

## 2020-11-03 MED ORDER — MUPIROCIN 2 % EX OINT
TOPICAL_OINTMENT | CUTANEOUS | Status: AC
  Administered 2020-11-03: 1 via NASAL
  Filled 2020-11-03: qty 22

## 2020-11-03 MED ORDER — METHOCARBAMOL 500 MG PO TABS
500.0000 mg | ORAL_TABLET | Freq: Four times a day (QID) | ORAL | Status: DC | PRN
  Administered 2020-11-03 – 2020-11-05 (×3): 500 mg via ORAL
  Filled 2020-11-03 (×3): qty 1

## 2020-11-03 MED ORDER — HYDROCODONE-ACETAMINOPHEN 5-325 MG PO TABS
1.0000 | ORAL_TABLET | ORAL | Status: DC | PRN
Start: 2020-11-03 — End: 2020-11-05
  Administered 2020-11-03: 2 via ORAL
  Administered 2020-11-03: 1 via ORAL
  Administered 2020-11-04 – 2020-11-05 (×4): 2 via ORAL
  Filled 2020-11-03 (×6): qty 2

## 2020-11-03 MED ORDER — ONDANSETRON HCL 4 MG/2ML IJ SOLN
INTRAMUSCULAR | Status: AC
  Filled 2020-11-03: qty 2

## 2020-11-03 MED ORDER — DOCUSATE SODIUM 100 MG PO CAPS
100.0000 mg | ORAL_CAPSULE | Freq: Two times a day (BID) | ORAL | Status: DC
  Administered 2020-11-03 – 2020-11-04 (×4): 100 mg via ORAL
  Filled 2020-11-03 (×5): qty 1

## 2020-11-03 MED ORDER — DOXAZOSIN MESYLATE 4 MG PO TABS: 4.0000 mg | ORAL_TABLET | Freq: Every evening | ORAL | Status: DC | PRN

## 2020-11-03 MED ORDER — LORAZEPAM 1 MG PO TABS
2.0000 mg | ORAL_TABLET | Freq: Once | ORAL | Status: AC
  Administered 2020-11-03: 2 mg via ORAL
  Filled 2020-11-03: qty 2

## 2020-11-03 SURGICAL SUPPLY — 56 items
AGENT HMST KT MTR STRL THRMB (HEMOSTASIS)
BAG DECANTER FOR FLEXI CONT (MISCELLANEOUS) IMPLANT
BAR EXFX 500X11 NS LF (EXFIX) ×2
BAR GLASS FIBER EXFX 11X500 (EXFIX) ×2 IMPLANT
BNDG CMPR 75X41 PLY ABS (GAUZE/BANDAGES/DRESSINGS) ×1
BNDG ELASTIC 4X5.8 VLCR STR LF (GAUZE/BANDAGES/DRESSINGS) ×1 IMPLANT
BNDG ELASTIC 6X5.8 VLCR STR LF (GAUZE/BANDAGES/DRESSINGS) ×1 IMPLANT
BNDG STRETCH 4X75 NS LF (GAUZE/BANDAGES/DRESSINGS) ×1 IMPLANT
COVER MAYO STAND STRL (DRAPES) ×1 IMPLANT
COVER WAND RF STERILE (DRAPES) ×1 IMPLANT
DRAPE C-ARM 42X120 X-RAY (DRAPES) ×1 IMPLANT
DRAPE C-ARMOR (DRAPES) ×2 IMPLANT
DRAPE HALF SHEET 40X57 (DRAPES) ×3 IMPLANT
DRAPE INCISE IOBAN 66X45 STRL (DRAPES) IMPLANT
DRAPE OEC MINIVIEW 54X84 (DRAPES) IMPLANT
DRAPE U-SHAPE 47X51 STRL (DRAPES) ×2 IMPLANT
DRSG ADAPTIC 3X8 NADH LF (GAUZE/BANDAGES/DRESSINGS) ×1 IMPLANT
DRSG PAD ABDOMINAL 8X10 ST (GAUZE/BANDAGES/DRESSINGS) ×1 IMPLANT
DRSG XEROFORM 1X8 (GAUZE/BANDAGES/DRESSINGS) ×1 IMPLANT
DURAPREP 26ML APPLICATOR (WOUND CARE) ×2 IMPLANT
ELECT PENCIL ROCKER SW 15FT (MISCELLANEOUS) ×1 IMPLANT
ELECT REM PT RETURN 9FT ADLT (ELECTROSURGICAL) ×2
ELECTRODE REM PT RTRN 9FT ADLT (ELECTROSURGICAL) ×1 IMPLANT
GLOVE BIO SURGEON STRL SZ 6.5 (GLOVE) ×2 IMPLANT
GLOVE BIO SURGEON STRL SZ8.5 (GLOVE) ×2 IMPLANT
GLOVE BIOGEL PI IND STRL 8.5 (GLOVE) ×1 IMPLANT
GLOVE BIOGEL PI INDICATOR 8.5 (GLOVE) ×1
GLOVE SURG UNDER POLY LF SZ6.5 (GLOVE) ×2 IMPLANT
GOWN STRL REUS W/ TWL LRG LVL3 (GOWN DISPOSABLE) ×1 IMPLANT
GOWN STRL REUS W/TWL 2XL LVL3 (GOWN DISPOSABLE) ×4 IMPLANT
GOWN STRL REUS W/TWL LRG LVL3 (GOWN DISPOSABLE)
KIT BASIN OR (CUSTOM PROCEDURE TRAY) ×2 IMPLANT
KIT TURNOVER KIT B (KITS) ×2 IMPLANT
MANIFOLD NEPTUNE II (INSTRUMENTS) ×1 IMPLANT
NS IRRIG 1000ML POUR BTL (IV SOLUTION) ×2 IMPLANT
PACK ORTHO EXTREMITY (CUSTOM PROCEDURE TRAY) ×2 IMPLANT
PAD ARMBOARD 7.5X6 YLW CONV (MISCELLANEOUS) ×4 IMPLANT
PAD CAST 4YDX4 CTTN HI CHSV (CAST SUPPLIES) ×1 IMPLANT
PADDING CAST COTTON 4X4 STRL (CAST SUPPLIES)
PENCIL BUTTON HOLSTER BLD 10FT (ELECTRODE) ×1 IMPLANT
PIN CLAMP 2BAR 75MM BLUE (EXFIX) ×2 IMPLANT
PIN HALF YELLOW 5X160X35 (EXFIX) ×4 IMPLANT
STOCKINETTE 6  STRL (DRAPES)
STOCKINETTE 6 STRL (DRAPES) ×1 IMPLANT
SUCTION FRAZIER HANDLE 10FR (MISCELLANEOUS)
SUCTION TUBE FRAZIER 10FR DISP (MISCELLANEOUS) ×1 IMPLANT
SURGIFLO W/THROMBIN 8M KIT (HEMOSTASIS) IMPLANT
SUT BONE WAX W31G (SUTURE) ×1 IMPLANT
SUT ETHILON 3 0 PS 1 (SUTURE) ×1 IMPLANT
SUT VIC AB 2-0 CT1 18 (SUTURE) ×1 IMPLANT
SUT VIC AB 2-0 CTB1 (SUTURE) ×1 IMPLANT
TOWEL GREEN STERILE (TOWEL DISPOSABLE) ×2 IMPLANT
TOWEL GREEN STERILE FF (TOWEL DISPOSABLE) ×2 IMPLANT
TUBE CONNECTING 12X1/4 (SUCTIONS) ×2 IMPLANT
WATER STERILE IRR 1000ML POUR (IV SOLUTION) ×1 IMPLANT
YANKAUER SUCT BULB TIP NO VENT (SUCTIONS) IMPLANT

## 2020-11-03 NOTE — Transfer of Care (Signed)
Immediate Anesthesia Transfer of Care Note  Patient: Natalie Berg  Procedure(s) Performed: EXTERNAL FIXATION LEG (Left )  Patient Location: PACU  Anesthesia Type:General  Level of Consciousness: drowsy, patient cooperative and responds to stimulation  Airway & Oxygen Therapy: Patient Spontanous Breathing and Patient connected to face mask oxygen  Post-op Assessment: Report given to RN and Post -op Vital signs reviewed and stable  Post vital signs: Reviewed and stable  Last Vitals:  Vitals Value Taken Time  BP 121/64 11/03/20 0948  Temp    Pulse 109 11/03/20 0948  Resp 19 11/03/20 0948  SpO2 96 % 11/03/20 0948  Vitals shown include unvalidated device data.  Last Pain:  Vitals:   11/03/20 0715  TempSrc:   PainSc: 0-No pain      Patients Stated Pain Goal: 3 (11/02/20 2311)  Complications: No complications documented.

## 2020-11-03 NOTE — Discharge Instructions (Signed)
Displaced Tibial Plateau Fracture  A tibial plateau fracture is a break in the top of the shin bone (tibia). The top of the tibia has a flat, smooth surface (tibial plateau). This part of the tibia is softer than the rest of the bone. It forms the bottom of the knee joint. If a strong force pushes the thigh bone (femur) down onto the tibial plateau, the tibial plateau can collapse or break apart at the edges. This may also be called an intra-articular fracture. A displaced fracture means that one or more pieces of the broken bone have moved out of their normal position. Without treatment, this fracture can make the knee unstable. It can also lead to joint stiffness (arthritis) or difficulty walking. What are the causes? Common causes of this type of fracture include:  Car accidents.  Jumps or falls from a significant height.  Injuries from activities that put a lot of force on the knee, such as injuries from skiing, mountain biking, or contact sports. What increases the risk? You may be at higher risk for this type of fracture if:  You play sports that put a lot of force on the knee, including contact sports.  You have a history of bone infections.  You are an older person with a condition that causes weak bones (osteoporosis). What are the signs or symptoms? Symptoms of a displaced tibial plateau fracture may include:  Pain that gets worse when putting weight on the knee.  Knee swelling and bruising.  The knee having an abnormal shape (deformity).  The foot looking pale and feeling cool to the touch.  Having a feeling of pins and needles around the foot. How is this diagnosed? This condition may be diagnosed based on:  Your symptoms and medical history. Your health care provider may ask about recent knee or leg injuries you have had.  A physical exam.  X-rays.  CT scan. This may be done to: ? Identify how much the bone has moved out of place. ? Find any broken-off pieces  of bone. How is this treated? Treatment depends on how severe your fracture is and how the pieces of the broken bone line up with each other (alignment).  If the pieces of the broken bone are too far apart, you will need to have surgery. During surgery, pieces of broken bone will be moved back into position, and screws or other types of hardware will be used to hold the bone pieces in place (open reduction with internal fixation, ORIF).  In some cases, the condition of the soft tissue is so poor that the use of a plate or rod might threaten it further. A stabilizer to hold the broken bones in proper position (external fixator) may then be considered as the final treatment. The external fixator is removed when the injury has healed. Follow these instructions at home: Medicines  Take over-the-counter and prescription medicines only as told by your health care provider.  Ask your health care provider if the medicine prescribed to you: ? Requires you to avoid driving or using machinery. ? Can cause constipation. You may need to take these actions to prevent or treat constipation:  Drink enough fluid to keep your urine pale yellow.  Take over-the-counter or prescription medicines.  Eat foods that are high in fiber, such as beans, whole grains, and fresh fruits and vegetables.  Limit foods that are high in fat and processed sugars, such as fried or sweet foods. Activity  Do not use the  injured limb to support your body weight until your health care provider says that you can. Use crutches, a cane, or a walker as told by your health care provider.  Return to your normal activities as told by your health care provider. Ask your health care provider what activities are safe for you.  For the time period you were told by your health care provider do not drive or use machinery. Managing pain, stiffness, and swelling  If directed, put ice on the painful areas. To do this: ? Put ice in a plastic  bag. ? Place a towel between your skin and the bag. ? Leave the ice on for 20 minutes, 2-3 times a day. ? Remove the ice if your skin turns bright red. This is very important. If you cannot feel pain, heat, or cold, you have a greater risk of damage to the area.  Move your toes and ankle often to reduce stiffness and swelling.  Raise (elevate) the injured area above the level of your heart while you are sitting or lying down.   General instructions  Do not take baths, swim, or use a hot tub until your health care provider approves. Ask your health care provider if you may take showers. You may only be allowed to take sponge baths.  Do not use any products that contain nicotine or tobacco, such as cigarettes, e-cigarettes, and chewing tobacco. These can delay bone healing. If you need help quitting, ask your health care provider.  Keep all follow-up visits. This is important. Contact a health care provider if you:  Have a fever or chills.  Have pain that does not get better with medicine. Get help right away if you have:  Severe pain or swelling.  New pain, swelling, or warmth in your lower leg.  New numbness or a cold feeling in your lower leg.  Chest pain.  Difficulty breathing. Summary  A tibial plateau fracture is a break in the bone that forms the bottom of the knee joint (tibia or shin bone).  Common causes of this type of fracture include car accidents, jumps or falls from a significant height, and sports injuries. You are more at risk if you are older and have a condition that causes weak bones.  If the pieces of the broken bone are too far apart, you will need to have surgery This information is not intended to replace advice given to you by your health care provider. Make sure you discuss any questions you have with your health care provider. Document Revised: 12/21/2019 Document Reviewed: 12/21/2019 Elsevier Patient Education  2021 [employer].

## 2020-11-03 NOTE — Anesthesia Preprocedure Evaluation (Signed)
Anesthesia Evaluation  Patient identified by MRN, date of birth, ID band Patient awake    Reviewed: Allergy & Precautions, H&P , NPO status , Patient's Chart, lab work & pertinent test results  Airway Mallampati: III  TM Distance: >3 FB Neck ROM: Full    Dental no notable dental hx. (+) Teeth Intact, Dental Advisory Given   Pulmonary asthma ,    Pulmonary exam normal breath sounds clear to auscultation       Cardiovascular + Valvular Problems/Murmurs MVP  Rhythm:Regular Rate:Normal     Neuro/Psych  Headaches, Anxiety Depression    GI/Hepatic Neg liver ROS, GERD  ,  Endo/Other  negative endocrine ROS  Renal/GU negative Renal ROS  negative genitourinary   Musculoskeletal  (+) Arthritis , Osteoarthritis,    Abdominal   Peds  Hematology negative hematology ROS (+)   Anesthesia Other Findings   Reproductive/Obstetrics negative OB ROS                             Anesthesia Physical Anesthesia Plan  ASA: II  Anesthesia Plan: General   Post-op Pain Management:    Induction: Intravenous  PONV Risk Score and Plan: 4 or greater and Ondansetron, Dexamethasone and Midazolam  Airway Management Planned: LMA  Additional Equipment:   Intra-op Plan:   Post-operative Plan: Extubation in OR  Informed Consent: I have reviewed the patients History and Physical, chart, labs and discussed the procedure including the risks, benefits and alternatives for the proposed anesthesia with the patient or authorized representative who has indicated his/her understanding and acceptance.     Dental advisory given  Plan Discussed with: CRNA  Anesthesia Plan Comments:         Anesthesia Quick Evaluation

## 2020-11-03 NOTE — Evaluation (Signed)
Physical Therapy Evaluation Patient Details Name: Natalie Berg MRN: [id number] DOB: 1970-10-11 Today's Date: 11/03/2020   History of Present Illness  50 y.o. female who fell at home on Thursday evening (4/14)  and presented to the emergency room with a left medial plateau fracture. Patient s/p L LE spanning ex fix application on 4/16. PMH: GERD, depression, anxiety, mitral valve prolapse, peripheral neuropathy.  Clinical Impression  PTA, patient living between [place] and [place] as her and husband have 2 homes (mother lived here but passed away 1 month ago). Patient reports independence with mobility prior, drives, and does not work. Educated patient on WB restrictions. Patient overall min guard for mobility with RW. Patient demos good ability to maintain TDWB throughout ambulation. Initiated education on stair training and car transfer but will continue to elaborate and practice prior to returning home. Patient presents with decreased L LE strength, decreased activity tolerance, and impaired functional mobility. Recommend HHPT following discharge to maximize functional mobility and safety in the home.     Follow Up Recommendations Home health PT;Supervision/Assistance - 24 hour    Equipment Recommendations  Rolling Ko Bardon with 5" wheels;3in1 (PT)    Recommendations for Other Services OT consult     Precautions / Restrictions Precautions Precautions: Fall Precaution Comments: ex fix Restrictions Weight Bearing Restrictions: Yes LLE Weight Bearing: Touchdown weight bearing      Mobility  Bed Mobility Overal bed mobility: Needs Assistance Bed Mobility: Supine to Sit     Supine to sit: Min guard     General bed mobility comments: min guard for safety. Placed hand under patient's foot to offweight but able to move without assistance    Transfers Overall transfer level: Needs assistance Equipment used: Rolling Rae Sutcliffe (2 wheeled) Transfers: Sit to/from Stand Sit to  Stand: Min assist;Min guard         General transfer comment: sit to stand x 3 from various surfaces. Initially minA but progressed to min guard. Cues for hand placement as patient unsure of sequencing to stand  Ambulation/Gait Ambulation/Gait assistance: Min guard Gait Distance (Feet): 15 Feet Assistive device: Rolling Sanyia Dini (2 wheeled) Gait Pattern/deviations: Step-to pattern;Decreased stride length ("hop to") Gait velocity: decreased   General Gait Details: Good ability to maintain TDWB, however minimal clearance of R LE at times. Min guard for Wellsite geologist    Modified Rankin (Stroke Patients Only)       Balance Overall balance assessment: Needs assistance Sitting-balance support: No upper extremity supported;Feet supported Sitting balance-Leahy Scale: Good     Standing balance support: Bilateral upper extremity supported;During functional activity Standing balance-Leahy Scale: Poor Standing balance comment: reliant on UE support                             Pertinent Vitals/Pain Pain Assessment: Faces Faces Pain Scale: Hurts a little bit Pain Location: L LE Pain Descriptors / Indicators: Grimacing Pain Intervention(s): Monitored during session;Repositioned    Home Living Family/patient expects to be discharged to:: Private residence Living Arrangements: Alone (lives alone in [place] and lives with husband in [place] but travels back and forth) Available Help at Discharge: Family;Available PRN/intermittently Type of Home: House Home Access: Stairs to enter Entrance Stairs-Rails: Left Entrance Stairs-Number of Steps: 2 Home Layout: One level Home Equipment: Grab bars - tub/shower;Shower seat      Prior Function Level of Independence: Independent  Comments: drives, not working, travels between [place] and [employer] 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     Higher education careers adviser        Extremity/Trunk  Assessment   Upper Extremity Assessment Upper Extremity Assessment: Overall WFL for tasks assessed    Lower Extremity Assessment Lower Extremity Assessment: LLE deficits/detail LLE Deficits / Details: able to partially perform SLR against gravity LLE: Unable to fully assess due to immobilization    Cervical / Trunk Assessment Cervical / Trunk Assessment: Normal  Communication   Communication: No difficulties  Cognition Arousal/Alertness: Awake/alert Behavior During Therapy: WFL for tasks assessed/performed Overall Cognitive Status: Within Functional Limits for tasks assessed                                        General Comments      Exercises     Assessment/Plan    PT Assessment Patient needs continued PT services  PT Problem List Decreased strength;Decreased range of motion;Decreased activity tolerance;Decreased balance;Decreased mobility       PT Treatment Interventions DME instruction;Gait training;Stair training;Functional mobility training;Therapeutic activities;Therapeutic exercise;Balance training;Patient/family education    PT Goals (Current goals can be found in the Care Plan section)  Acute Rehab PT Goals Patient Stated Goal: to move better PT Goal Formulation: With patient Time For Goal Achievement: 11/17/20 Potential to Achieve Goals: Good    Frequency Min 5X/week   Barriers to discharge        Co-evaluation               AM-PAC PT "6 Clicks" Mobility  Outcome Measure Help needed turning from your back to your side while in a flat bed without using bedrails?: A Little Help needed moving from lying on your back to sitting on the side of a flat bed without using bedrails?: A Little Help needed moving to and from a bed to a chair (including a wheelchair)?: A Little Help needed standing up from a chair using your arms (e.g., wheelchair or bedside chair)?: A Little Help needed to walk in hospital room?: A Little Help needed  climbing 3-5 steps with a railing? : A Lot 6 Click Score: 17    End of Session Equipment Utilized During Treatment: Gait belt Activity Tolerance: Patient tolerated treatment well Patient left: in chair;with call bell/phone within reach;with nursing/sitter in room;with chair alarm set Nurse Communication: Mobility status PT Visit Diagnosis: Muscle weakness (generalized) (M62.81);Difficulty in walking, not elsewhere classified (R26.2)    Time: 0962-8366 PT Time Calculation (min) (ACUTE ONLY): 54 min   Charges:   PT Evaluation $PT Eval Moderate Complexity: 1 Mod PT Treatments $Gait Training: 8-22 mins $Therapeutic Activity: 8-22 mins        Harris Kistler A. Dan Humphreys PT, DPT Acute Rehabilitation Services Pager [phone] Office [phone]   Viviann Spare 11/03/2020, 5:13 PM

## 2020-11-03 NOTE — Op Note (Signed)
OPERATIVE REPORT  DATE OF SURGERY: 11/03/2020  PATIENT NAME:  Natalie Berg MRN: [id number] DOB: [birth year]  PCP: Johny Blamer, MD  PRE-OPERATIVE DIAGNOSIS: Left tibial plateau fracture  POST-OPERATIVE DIAGNOSIS: Same  PROCEDURE:   Application of left lower extremity spanning external fixator  SURGEON:  Venita Lick, MD  PHYSICIAN ASSISTANT: None  ANESTHESIA:   General  EBL: Minimal   Zimmer/Biomet external fixator frame.  Utilized for half pins and 2 long rods.  BRIEF HISTORY: CLEOLA PERRYMAN is a 50 y.o. female who fell at home on Thursday evening and presented to the emergency room with a left medial plateau fracture.  The patient was admitted for further orthopedic management.  After consultation with her orthopedic specialist Dr. Carola Frost decision was made to apply an external fixator frame as a temporizing measure and then move forward with definitive ORIF in the future.  I have discussed the plan with the patient and she is in agreement.  She presents this morning for application of an external fixator frame.  PROCEDURE DETAILS: Patient was brought into the operating room. After successful induction of general anesthesia and endotracheal intubation a Time Out was done. This confirmed all pertinent important data.  The left lower extremity was prepped and draped in a standard fashion.  Using fluoroscopy identified the midportion of the femur.  I placed my first half pin approximately one handbreadth proximal to the knee joint.  I marked the midline of the femur and made a small incision.  I then bluntly dissected down to the bone.  I then placed the guidepin and palpated the medial and lateral edges of the bone and placed it in the center.  I confirmed satisfactory position with AP fluoroscopy.  Under power I placed the first set screw.  I had excellent bicortical purchase.  I then used the targeting device to plan the second 1 and using the same technique I placed the second she  had screw.  I then took AP and lateral films of the pins to confirm I had bicortical purchase and proper positioning.  I then went to the midportion of the tibia and again I confirmed the distal stent of the fracture and then moved my first plan approximately 3 cm distal.  I made a small incision approximately 1 fingerbreadth lateral to the tibial crest and sharply dissected down to the tibia.  I placed my targeting device and then placed the first Schanz half pin.  I then placed the second 1.  I then used fluoroscopy in both the AP and lateral planes to confirm that had bicortical purchase and proper positioning of each of the screws.  I then built my external fixator frame.  Once it was secured I bent the knee approximately 20 degrees and then locked the knee in place.  Once all of the external fixator nuts were tightened I took my final AP and lateral x-rays at the pin sites and the fracture site.  The fracture itself was reduced there was no displacement in either plane.  At this point I cleansed the skin dried it and then placed Xeroform dressings over each of the pin sites.  Bulky dry dressings were then applied.  The patient was ultimately extubated transfer the PACU without incident.  The end of the case all needle sponge counts were correct.  There were no adverse intraoperative events. Venita Lick, MD 11/03/2020 9:36 AM

## 2020-11-03 NOTE — Anesthesia Postprocedure Evaluation (Signed)
Anesthesia Post Note  Patient: Natalie Berg  Procedure(s) Performed: EXTERNAL FIXATION LEG (Left )     Patient location during evaluation: PACU Anesthesia Type: General Level of consciousness: awake and alert Pain management: pain level controlled Vital Signs Assessment: post-procedure vital signs reviewed and stable Respiratory status: spontaneous breathing, nonlabored ventilation, respiratory function stable and patient connected to nasal cannula oxygen Cardiovascular status: blood pressure returned to baseline and stable Postop Assessment: no apparent nausea or vomiting Anesthetic complications: no   No complications documented.  Last Vitals:  Vitals:   11/03/20 1020 11/03/20 1100  BP: (!) 148/65 134/79  Pulse: 99 (!) 102  Resp: 18 18  Temp: (!) 36.4 C 37.1 C  SpO2: 94% 95%    Last Pain:  Vitals:   11/03/20 1020  TempSrc:   PainSc: 2                  Gracelynn Bircher,W. EDMOND

## 2020-11-03 NOTE — Brief Op Note (Signed)
11/03/2020  9:42 AM  PATIENT:  Natalie Berg  50 y.o. female  PRE-OPERATIVE DIAGNOSIS:  Closed fx left tibia  POST-OPERATIVE DIAGNOSIS:  Closed fx left tibia  PROCEDURE:  Procedure(s): EXTERNAL FIXATION LEG (Left)  SURGEON:  Surgeon(s) and Role:    Venita Lick, MD - Primary  PHYSICIAN ASSISTANT:   ASSISTANTS: none   ANESTHESIA:   general  EBL:  25 mL   BLOOD ADMINISTERED:none  DRAINS: none   LOCAL MEDICATIONS USED:  NONE  SPECIMEN:  No Specimen  DISPOSITION OF SPECIMEN:  N/A  COUNTS:  YES  TOURNIQUET:  * No tourniquets in log *  DICTATION: .Dragon Dictation  PLAN OF CARE: Admit to inpatient   PATIENT DISPOSITION:  PACU - hemodynamically stable.

## 2020-11-03 NOTE — Anesthesia Procedure Notes (Signed)
Procedure Name: LMA Insertion Date/Time: 11/03/2020 8:58 AM Performed by: Lonia Mad, CRNA Pre-anesthesia Checklist: Patient identified, Emergency Drugs available, Suction available and Patient being monitored Patient Re-evaluated:Patient Re-evaluated prior to induction Oxygen Delivery Method: Circle System Utilized Preoxygenation: Pre-oxygenation with 100% oxygen Induction Type: IV induction Ventilation: Mask ventilation without difficulty LMA: LMA inserted LMA Size: 4.0 Number of attempts: 1 Airway Equipment and Method: Bite block Placement Confirmation: positive ETCO2 Tube secured with: Tape Dental Injury: Teeth and Oropharynx as per pre-operative assessment

## 2020-11-03 NOTE — Progress Notes (Signed)
    Subjective: Day of Surgery Procedure(s) (LRB): EXTERNAL FIXATION LEG (Left) Patient reports pain as 4 on 0-10 scale.   Denies CP or SOB.  Voiding without difficulty. Positive flatus. Objective: Vital signs in last 24 hours: Temp:  [98.8 F (37.1 C)-99.7 F (37.6 C)] 98.8 F (37.1 C) (04/16 0416) Pulse Rate:  [84-100] 100 (04/16 0416) Resp:  [16] 16 (04/16 0416) BP: (125-134)/(69-78) 126/70 (04/16 0416) SpO2:  [91 %-93 %] 91 % (04/16 0416)  Intake/Output from previous day: 04/15 0701 - 04/16 0700 In: 747.2 [I.V.:747.2] Out: 1400 [Urine:1400] Intake/Output this shift: No intake/output data recorded.  Labs: Recent Labs    11/02/20 0002  HGB 12.5   Recent Labs    11/02/20 0002  WBC 14.8*  RBC 5.07  HCT 40.0  PLT 405*   Recent Labs    11/02/20 0002  NA 135  K 3.7  CL 102  CO2 24  BUN 12  CREATININE 0.77  GLUCOSE 134*  CALCIUM 9.6   No results for input(s): LABPT, INR in the last 72 hours.  Physical Exam: Neurologically intact Neurovascular intact Intact pulses distally Compartment soft Body mass index is 33.67 kg/m.   Assessment/Plan: Day of Surgery Procedure(s) (LRB): EXTERNAL FIXATION LEG (Left) Plan on moving forward with external fixation of the left lower extremity for the tibial plateau fracture.  Patient will ultimately follow-up with Dr. Carola Frost to discuss definitive fracture management. I have again reviewed the risks, benefits, and alternatives to surgery with the patient and all of her questions and concerns were addressed.  She is expressed a willingness to move forward with surgery.  Alvy Beal for Dr. Venita Lick [facility] [phone] 11/03/2020, 8:33 AM

## 2020-11-04 ENCOUNTER — Encounter (HOSPITAL_COMMUNITY): Payer: Self-pay | Admitting: Orthopedic Surgery

## 2020-11-04 LAB — CBC
HCT: 31.5 % — ABNORMAL LOW (ref 36.0–46.0)
Hemoglobin: 9.6 g/dL — ABNORMAL LOW (ref 12.0–15.0)
MCH: 24.1 pg — ABNORMAL LOW (ref 26.0–34.0)
MCHC: 30.5 g/dL (ref 30.0–36.0)
MCV: 79.1 fL — ABNORMAL LOW (ref 80.0–100.0)
Platelets: 295 10*3/uL (ref 150–400)
RBC: 3.98 MIL/uL (ref 3.87–5.11)
RDW: 16.5 % — ABNORMAL HIGH (ref 11.5–15.5)
WBC: 11.7 10*3/uL — ABNORMAL HIGH (ref 4.0–10.5)
nRBC: 0 % (ref 0.0–0.2)

## 2020-11-04 MED ORDER — LORAZEPAM 1 MG PO TABS
1.0000 mg | ORAL_TABLET | Freq: Two times a day (BID) | ORAL | Status: DC | PRN
  Administered 2020-11-05 (×2): 2 mg via ORAL
  Filled 2020-11-04 (×2): qty 2

## 2020-11-04 MED ORDER — ENOXAPARIN SODIUM 40 MG/0.4ML ~~LOC~~ SOLN
40.0000 mg | SUBCUTANEOUS | Status: DC
  Administered 2020-11-04 – 2020-11-05 (×2): 40 mg via SUBCUTANEOUS
  Filled 2020-11-04 (×2): qty 0.4

## 2020-11-04 NOTE — Progress Notes (Signed)
Urine culture collected and sent.

## 2020-11-04 NOTE — Plan of Care (Signed)
  Problem: Education: Goal: Knowledge of General Education information will improve Description: Including pain rating scale, medication(s)/side effects and non-pharmacologic comfort measures Outcome: Progressing   Problem: Clinical Measurements: Goal: Will remain free from infection Outcome: Progressing   Problem: Activity: Goal: Risk for activity intolerance will decrease Outcome: Progressing   Problem: Nutrition: Goal: Adequate nutrition will be maintained Outcome: Progressing   Problem: Elimination: Goal: Will not experience complications related to bowel motility Outcome: Progressing   Problem: Skin Integrity: Goal: Risk for impaired skin integrity will decrease Outcome: Progressing   Problem: Safety: Goal: Ability to remain free from injury will improve Outcome: Progressing   Problem: Pain Managment: Goal: General experience of comfort will improve Outcome: Progressing

## 2020-11-04 NOTE — Plan of Care (Signed)
  Problem: Activity: Goal: Risk for activity intolerance will decrease Outcome: Progressing   Problem: Pain Managment: Goal: General experience of comfort will improve Outcome: Progressing   Problem: Safety: Goal: Ability to remain free from injury will improve Outcome: Progressing   
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
## 2020-11-04 NOTE — Evaluation (Signed)
Occupational Therapy Evaluation Patient Details Name: Natalie Berg MRN: [id number] DOB: [birth year] Today's Date: 11/04/2020    History of Present Illness 50 y.o. female who fell at home on Thursday evening (4/14)  and presented to the emergency room with a left medial plateau fracture. Patient s/p L LE spanning ex fix application on 4/16. PMH: GERD, depression, anxiety, mitral valve prolapse, peripheral neuropathy.   Clinical Impression   PTA patient independent and driving. Admitted for above and limited by problem list below, including pain in LLE, impaired balance and decreased activity tolerance. She was educated on precautions, ADL compensatory techniques, DME and recommendations.  She requires min guard for transfers and in room mobility using RW, up to mod assist for LB ADLs.  Patient reports plan to stay in [place], and will have intermittent assist from her brother and nephew.  She will benefit from continued OT services while admitted to optimize independence safety with ADLs, mobility, but anticipate no further needs after dc home.    Follow Up Recommendations  No OT follow up;Supervision - Intermittent    Equipment Recommendations  3 in 1 bedside commode    Recommendations for Other Services       Precautions / Restrictions Precautions Precautions: Fall Precaution Comments: ex fix Restrictions Weight Bearing Restrictions: Yes LLE Weight Bearing: Touchdown weight bearing      Mobility Bed Mobility Overal bed mobility: Needs Assistance Bed Mobility: Supine to Sit     Supine to sit: Supervision     General bed mobility comments: increased time but no physical assist    Transfers Overall transfer level: Needs assistance Equipment used: Rolling walker (2 wheeled) Transfers: Sit to/from Stand Sit to Stand: Min guard         General transfer comment: min guard and increased time to power up and steady    Balance Overall balance assessment: Needs  assistance Sitting-balance support: No upper extremity supported;Feet supported Sitting balance-Leahy Scale: Good     Standing balance support: Bilateral upper extremity supported;During functional activity Standing balance-Leahy Scale: Poor Standing balance comment: reliant on UE support                           ADL either performed or assessed with clinical judgement   ADL Overall ADL's : Needs assistance/impaired     Grooming: Set up;Sitting       Lower Body Bathing: Minimal assistance;Sit to/from stand   Upper Body Dressing : Set up;Sitting   Lower Body Dressing: Moderate assistance;Sit to/from stand Lower Body Dressing Details (indicate cue type and reason): educated on AE (verbally), hemi dressing techniques and 1 handed techniques in standing Toilet Transfer: Min guard;Ambulation;RW Toilet Transfer Details (indicate cue type and reason): simulated to recliner         Functional mobility during ADLs: Min guard;Rolling walker;Cueing for safety General ADL Comments: pt limited by TDWB, pain, balance     Vision   Vision Assessment?: No apparent visual deficits     Perception     Praxis      Pertinent Vitals/Pain Pain Assessment: Faces Faces Pain Scale: Hurts a little bit Pain Location: L LE Pain Descriptors / Indicators: Grimacing Pain Intervention(s): Limited activity within patient's tolerance;Monitored during session;Repositioned;Premedicated before session     Hand Dominance Right   Extremity/Trunk Assessment Upper Extremity Assessment Upper Extremity Assessment: Overall WFL for tasks assessed   Lower Extremity Assessment Lower Extremity Assessment: Defer to PT evaluation (ex fix to L LE)  Cervical / Trunk Assessment Cervical / Trunk Assessment: Normal   Communication Communication Communication: No difficulties   Cognition Arousal/Alertness: Awake/alert Behavior During Therapy: WFL for tasks assessed/performed Overall Cognitive  Status: Within Functional Limits for tasks assessed                                     General Comments       Exercises     Shoulder Instructions      Home Living Family/patient expects to be discharged to:: Private residence Living Arrangements: Alone (alone in [place] and lives with husband in [place] but travels back and forth) Available Help at Discharge: Family;Available PRN/intermittently Type of Home: House Home Access: Stairs to enter [employer] of Steps: 2 Entrance Stairs-Rails: Left Home Layout: One level     Bathroom Shower/Tub: Chief Strategy Officer: Handicapped height     Home Equipment: Grab bars - tub/shower;Wheelchair - manual;Grab bars - toilet;Walker - 2 wheels;Tub bench   Additional Comments: nephew and brother can assist PRN      Prior Functioning/Environment Level of Independence: Independent        Comments: drives, not working, travels between [place] and [employer] 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        OT Problem List: Impaired balance (sitting and/or standing);Decreased activity tolerance;Decreased knowledge of use of DME or AE;Decreased knowledge of precautions;Pain      OT Treatment/Interventions: Self-care/ADL training;DME and/or AE instruction;Therapeutic activities;Patient/family education;Balance training    OT Goals(Current goals can be found in the care plan section) Acute Rehab OT Goals Patient Stated Goal: to move better OT Goal Formulation: With patient Time For Goal Achievement: 11/18/20 Potential to Achieve Goals: Good  OT Frequency: Min 2X/week   Barriers to D/C:            Co-evaluation              AM-PAC OT "6 Clicks" Daily Activity     Outcome Measure Help from another person eating meals?: None Help from another person taking care of personal grooming?: A Little Help from another person toileting, which includes using toliet, bedpan, or urinal?: A Little Help from  another person bathing (including washing, rinsing, drying)?: A Little Help from another person to put on and taking off regular upper body clothing?: A Little Help from another person to put on and taking off regular lower body clothing?: A Lot 6 Click Score: 18   End of Session Equipment Utilized During Treatment: Rolling walker;Gait belt Nurse Communication: Mobility status  Activity Tolerance: Patient tolerated treatment well Patient left: in chair;with call bell/phone within reach;with chair alarm set  OT Visit Diagnosis: Other abnormalities of gait and mobility (R26.89);Muscle weakness (generalized) (M62.81);Pain;History of falling (Z91.81) Pain - Right/Left: Left Pain - part of body: Leg                Time: 7408-1448 OT Time Calculation (min): 38 min Charges:  OT General Charges $OT Visit: 1 Visit OT Evaluation $OT Eval Moderate Complexity: 1 Mod OT Treatments $Self Care/Home Management : 23-37 mins  Barry Brunner, OT Acute Rehabilitation Services Pager [phone] Office [phone]   Chancy Milroy 11/04/2020, 11:23 AM

## 2020-11-05 LAB — URINE CULTURE
Culture: NO GROWTH
Special Requests: NORMAL

## 2020-11-05 NOTE — Progress Notes (Signed)
Discharge summary packet provided to pt with instructions.Pt verbalized understandig of instructions with no complaints. Pt remains alert/oriented in no apparent distress. D/C to home as ordered. Spouse is responsible for her ride back home.

## 2020-11-05 NOTE — Progress Notes (Signed)
Physical Therapy Treatment Patient Details Name: Natalie Berg MRN: [id number] DOB: 1970/09/27 Today's Date: 11/05/2020    History of Present Illness 50 y.o. female who fell at home on Thursday evening (4/14)  and presented to the emergency room with a left medial plateau fracture. Patient s/p L LE spanning ex fix application on 4/16. PMH: GERD, depression, anxiety, mitral valve prolapse, peripheral neuropathy.    PT Comments    Pt was lying supine in bed. She has improved with gait distance and is working toward stair goals. She demonstrated understanding of TDWB precautions and does well maintaining during ambulation and stair training. Pt asked about services after and when told HHPT was recommended she stated "I don't think I'm going to do that". She would benefit from continued PT to increase strength and endurance. Pt set to d/c today and HHPT remains appropriate upon d/c. Plan to return to assist with car t/f at time of d/c as pt is concerned with technique.     Follow Up Recommendations  Home health PT;Supervision/Assistance - 24 hour (When discussing with the pt she stated she does not think she'll do HHPT, however continued to recommend it.)     Equipment Recommendations  Rolling walker with 5" wheels;3in1 (PT)    Recommendations for Other Services OT consult     Precautions / Restrictions Precautions Precautions: Fall Precaution Comments: ex fix Restrictions Weight Bearing Restrictions: Yes LLE Weight Bearing: Touchdown weight bearing    Mobility  Bed Mobility Overal bed mobility: Needs Assistance Bed Mobility: Supine to Sit     Supine to sit: Supervision;Min assist     General bed mobility comments: minA to offweight L LE in order to get it to EOB, but no physical assistance with the motion itself.    Transfers Overall transfer level: Needs assistance Equipment used: Rolling walker (2 wheeled) Transfers: Sit to/from Stand Sit to Stand: Min guard          General transfer comment: required min cues for sequencing and increased time to power into standing.  Ambulation/Gait Ambulation/Gait assistance: Min guard Gait Distance (Feet): 35 Feet Assistive device: Rolling walker (2 wheeled) Gait Pattern/deviations: Step-to pattern;Decreased stride length (Hop to)     General Gait Details: Pt remains able to maintain TDWB, but still has minimal clearance of R LE at times. Still remains min guard for safety.   Stairs Stairs: Yes Stairs assistance: Mod assist;Min guard Stair Management: With walker;Backwards;One rail Right Number of Stairs: 4 General stair comments: Pt was fearful of pushing into walker to ascend second stair so modified stair training at top of 2 steps to allow patient to sit in chair on landing. She was able to sit in the chair after ascending first step. Pt states she has a wheel chair she can use to do this at home and will be able to move further back on the landing to then use the walker to get into her home. When descending stair pt went from landing straight down to the first step maintaing TDWB of L LE and continued down to ground level in normal sequencing. She required mod cues for navigating stairs with walker and min guard for safety.   Wheelchair Mobility    Modified Rankin (Stroke Patients Only)       Balance Overall balance assessment: Needs assistance Sitting-balance support: No upper extremity supported;Feet supported Sitting balance-Leahy Scale: Good     Standing balance support: Bilateral upper extremity supported;During functional activity Standing balance-Leahy Scale: Poor  Cognition Arousal/Alertness: Awake/alert Behavior During Therapy: WFL for tasks assessed/performed Overall Cognitive Status: Within Functional Limits for tasks assessed                                        Exercises      General Comments        Pertinent  Vitals/Pain Pain Assessment: 0-10 Pain Score: 7  (Pt received pain meds few minutes prior to session.) Pain Location: L LE Pain Descriptors / Indicators: Sore;Tender Pain Intervention(s): Limited activity within patient's tolerance;Monitored during session;Repositioned    Home Living                      Prior Function            PT Goals (current goals can now be found in the care plan section) Acute Rehab PT Goals Patient Stated Goal: "I want to walk and do stairs so I can go home and do it on my own because I will not have anyone there to do it for me all the time" Potential to Achieve Goals: Good Progress towards PT goals: Progressing toward goals    Frequency    Min 5X/week      PT Plan Current plan remains appropriate    Co-evaluation              AM-PAC PT "6 Clicks" Mobility   Outcome Measure  Help needed turning from your back to your side while in a flat bed without using bedrails?: None Help needed moving from lying on your back to sitting on the side of a flat bed without using bedrails?: A Little Help needed moving to and from a bed to a chair (including a wheelchair)?: A Little Help needed standing up from a chair using your arms (e.g., wheelchair or bedside chair)?: A Little Help needed to walk in hospital room?: A Little Help needed climbing 3-5 steps with a railing? : A Lot (mod cues for sequencing.) 6 Click Score: 18    End of Session Equipment Utilized During Treatment: Gait belt Activity Tolerance: Patient tolerated treatment well Patient left: in chair;with call bell/phone within reach;with chair alarm set Nurse Communication: Mobility status PT Visit Diagnosis: Muscle weakness (generalized) (M62.81);Difficulty in walking, not elsewhere classified (R26.2)     Time: 1005-1040 PT Time Calculation (min) (ACUTE ONLY): 35 min  Charges:  $Gait Training: 8-22 mins $Therapeutic Activity: 8-22 mins                      Kathrine Haddock,  SPTA   Kathrine Haddock 11/05/2020, 12:51 PM

## 2020-11-05 NOTE — Progress Notes (Signed)
Subjective: 2 Days Post-Op Procedure(s) (LRB): EXTERNAL FIXATION LEG (Left) Patient reports pain as mild.   Tolerating PO without N/V No complaints this am  Objective: Vital signs in last 24 hours: Temp:  [97.4 F (36.3 C)-98.7 F (37.1 C)] 98 F (36.7 C) (04/18 0555) Pulse Rate:  [75-87] 87 (04/18 0555) Resp:  [18-19] 19 (04/18 0555) BP: (108-133)/(52-69) 125/69 (04/18 0555) SpO2:  [92 %-100 %] 92 % (04/18 0739)  Intake/Output from previous day: 04/17 0701 - 04/18 0700 In: 480 [P.O.:480] Out: -  Intake/Output this shift: No intake/output data recorded.  Recent Labs    11/04/20 0939  HGB 9.6*   Recent Labs    11/04/20 0939  WBC 11.7*  RBC 3.98  HCT 31.5*  PLT 295   No results for input(s): NA, K, CL, CO2, BUN, CREATININE, GLUCOSE, CALCIUM in the last 72 hours. No results for input(s): LABPT, INR in the last 72 hours.  Neurologically intact Neurovascular intact Sensation intact distally Intact pulses distally Dorsiflexion/Plantar flexion intact Incision: dressing C/D/I No cellulitis present Compartment soft   Assessment/Plan: 2 Days Post-Op Procedure(s) (LRB): EXTERNAL FIXATION LEG (Left) Advance diet Up with therapy, will practice steps today UA negative for UTI 3 in 1, rolling walker and HHPT ordered. Plan to D/C today with HHPT. DVT PPx: ASA NWB LLE  Plan to F/u as out-patient with Dr. Carola Frost Next week.    Natalie Berg 11/05/2020, 9:10 AM

## 2020-11-05 NOTE — TOC Initial Note (Signed)
Transition of Care [facility]) - Initial/Assessment Note    Patient Details  Name: Natalie Berg MRN: [id number] Date of Birth: 1970-09-03  [facility] CM/SW Contact:    Epifanio Lesches, RN Phone Number: 11/05/2020, 11:46 AM  Clinical Narrative:                 Pt tx from [facility] 4/15 after presenting with  L knee fracture, s/p a fall. Hx OF asthma. From home with supportive husband. PTA independent with ADL's, no DME usage. States has an old walker and manual w/c @ home which her mom used.       - s/p Application of left lower extremity spanning external fixator, 4/16 Plan : ORIF in the future  NCM shared PT/OT recommendations: per PT,  Home health PT;Supervision/Assistance - 24 hour, per OT, No OT follow up;Supervision - Intermittent. Pt states she will have the supervision needed once d/c , however, states she not interested in home health services until after ORIF. States she would like a 3 in 1/bsc and rolling walker @ d/c. Orders placed for DME: 3 in 1/bsc and rolling walker. Referral made with [employer]. Equipment will be delivered to bedside prior to d/c.  Pt states she will have transportation to home once d/c. Post hospital follow noted on AVS.  Pt without Rx med concerns or affordability issues.  TOC team will continue to monitor for needs.  Expected Discharge Plan: Home/Self Care (Pt declined home health services) Barriers to Discharge: Continued Medical Work up   Patient Goals and CMS Choice     Choice offered to / list presented to : Patient  Expected Discharge Plan and Services Expected Discharge Plan: Home/Self Care (Pt declined home health services)   Discharge Planning Services: CM Consult   Living arrangements for the past 2 months: Single Family Home Expected Discharge Date: 11/05/20               DME Arranged: 3-N-1,Walker rolling DME Agency: [employer] Date DME Agency Contacted: 11/05/20 Time DME Agency Contacted: 1141 Representative  spoke with at DME Agency: Velna Hatchet            Prior Living Arrangements/Services Living arrangements for the past 2 months: Single Family Home Lives with:: Spouse Patient language and need for interpreter reviewed:: Yes Do you feel safe going back to the place where you live?: Yes      Need for Family Participation in Patient Care: Yes (Comment) Care giver support system in place?: Yes (comment) Current home services: Home PT Criminal Activity/Legal Involvement Pertinent to Current Situation/Hospitalization: No - Comment as needed  Activities of Daily Living Home Assistive Devices/Equipment: None ADL Screening (condition at time of admission) Patient's cognitive ability adequate to safely complete daily activities?: Yes Is the patient deaf or have difficulty hearing?: No Does the patient have difficulty seeing, even when wearing glasses/contacts?: No Does the patient have difficulty concentrating, remembering, or making decisions?: No Patient able to express need for assistance with ADLs?: Yes Does the patient have difficulty dressing or bathing?: No Independently performs ADLs?: Yes (appropriate for developmental age) Does the patient have difficulty walking or climbing stairs?: Yes (Balance is off at times) Weakness of Legs: Both Weakness of Arms/Hands: Left  Permission Sought/Granted   Permission granted to share information with : Yes, Verbal Permission Granted  Share Information with NAME: Osker Mason (Spouse)  [phone]           Emotional Assessment Appearance:: Appears stated age Attitude/Demeanor/Rapport: Gracious Affect (typically  observed): Accepting Orientation: : Oriented to Self,Oriented to Place,Oriented to  Time,Oriented to Situation Alcohol / Substance Use: Not Applicable Psych Involvement: No (comment)  Admission diagnosis:  Surgery, elective [Z41.9] Tibial plateau fracture [S82.143A] Closed fracture of proximal end of left tibia, unspecified  fracture morphology, initial encounter [S82.102A] Patient Active Problem List   Diagnosis Date Noted  . Tibial plateau fracture 11/01/2020  . GERD (gastroesophageal reflux disease) 03/10/2020  . Frequent falls 09/09/2017  . Primary osteoarthritis of both hands 09/09/2017  . DDD (degenerative disc disease), cervical 09/09/2017  . Impingement syndrome of left shoulder 09/09/2017  . Anxiety and depression 09/09/2017  . Primary insomnia 09/09/2017  . Vitamin D deficiency 09/09/2017  . Age-related osteoporosis without current pathological fracture 06/18/2017  . Chronic left shoulder pain 06/18/2017  . DDD (degenerative disc disease), lumbar 05/15/2016  . HNP (herniated nucleus pulposus), cervical 04/30/2012    Class: Diagnosis of  . Postlaminectomy syndrome, cervical region 09/15/2011  . Postlaminectomy syndrome, lumbar region 09/15/2011  . Seasonal and perennial allergic rhinitis 11/04/2007  . Asthma, mild persistent 11/04/2007   PCP:  Johny Blamer, MD Pharmacy:   [facility] [place], [place] - [address] [facility] [address] [place] [address] Phone: [phone] Fax: [phone]  [id number] - [place], [place] - [address] AT [facility] [address] [place] [address] Phone: [phone] Fax: [phone]     Social Determinants of Health (SDOH) Interventions    Readmission Risk Interventions No flowsheet data found.

## 2020-11-05 NOTE — Progress Notes (Signed)
Physical Therapy Treatment Patient Details Name: Natalie Berg MRN: [id number] DOB: 17-Jun-1971 Today's Date: 11/05/2020    History of Present Illness 50 y.o. female who fell at home on Thursday evening (4/14)  and presented to the emergency room with a left medial plateau fracture. Patient s/p L LE spanning ex fix application on 4/16. PMH: GERD, depression, anxiety, mitral valve prolapse, peripheral neuropathy.    PT Comments    Pt seated edge of bed eager to d/c home.  Assisted patient to standing into a WC and rolled out to d/c into the back seat of her husband car.  Pt required min assistance and mod VCs to move into the back seat.  Assistance only to support leg.  Pt to d/c with support of spouse and left in his care.     Follow Up Recommendations  Home health PT;Supervision/Assistance - 24 hour     Equipment Recommendations  Rolling walker with 5" wheels;3in1 (PT)    Recommendations for Other Services       Precautions / Restrictions Precautions Precautions: Fall Precaution Comments: ex fix Restrictions Weight Bearing Restrictions: Yes LLE Weight Bearing: Touchdown weight bearing    Mobility  Bed Mobility               General bed mobility comments: Pt seated edge of bed on on arrival.    Transfers Overall transfer level: Needs assistance Equipment used: Rolling walker (2 wheeled) Transfers: Sit to/from Stand (also performed car transfer in back seat entering the drivers side.) Sit to Stand: Min guard         General transfer comment: required min cues for sequencing and increased time to power into standing from WC.  To enter the car pt required entry backward, backing her bottom toward the seat and then sitting on car bench, once in sitting used R LE while PTA supported LLE and she then scooted back into her car.  Ambulation/Gait Ambulation/Gait assistance: Min guard Gait Distance (Feet): 4 Feet (from WC to car.) Assistive device: Rolling walker (2  wheeled) Gait Pattern/deviations: Step-to pattern;Decreased stride length Gait velocity: decreased   General Gait Details: Pt remains able to maintain TDWB, but still has minimal clearance of R LE at times. Still remains min guard for safety.   Stairs             Wheelchair Mobility    Modified Rankin (Stroke Patients Only)       Balance Overall balance assessment: Needs assistance Sitting-balance support: No upper extremity supported;Feet supported Sitting balance-Leahy Scale: Good       Standing balance-Leahy Scale: Poor Standing balance comment: reliant on UE support                            Cognition Arousal/Alertness: Awake/alert Behavior During Therapy: WFL for tasks assessed/performed Overall Cognitive Status: Within Functional Limits for tasks assessed                                        Exercises      General Comments        Pertinent Vitals/Pain Pain Assessment: 0-10 Pain Score: 6  Pain Descriptors / Indicators: Sore;Tender Pain Intervention(s): Monitored during session;Repositioned    Home Living                      Prior Function  PT Goals (current goals can now be found in the care plan section) Acute Rehab PT Goals Patient Stated Goal: To get in car. PT Goal Formulation: With patient Potential to Achieve Goals: Good Progress towards PT goals: Progressing toward goals    Frequency    Min 5X/week      PT Plan Current plan remains appropriate    Co-evaluation              AM-PAC PT "6 Clicks" Mobility   Outcome Measure  Help needed turning from your back to your side while in a flat bed without using bedrails?: None Help needed moving from lying on your back to sitting on the side of a flat bed without using bedrails?: A Little Help needed moving to and from a bed to a chair (including a wheelchair)?: A Little Help needed standing up from a chair using your arms  (e.g., wheelchair or bedside chair)?: A Little Help needed to walk in hospital room?: A Little Help needed climbing 3-5 steps with a railing? : A Lot 6 Click Score: 18    End of Session Equipment Utilized During Treatment: Gait belt Activity Tolerance: Patient tolerated treatment well Patient left:  (in her husbands back seat to d/c home) Nurse Communication:  (d/c) PT Visit Diagnosis: Muscle weakness (generalized) (M62.81);Difficulty in walking, not elsewhere classified (R26.2)     Time: 1610-9604 PT Time Calculation (min) (ACUTE ONLY): 15 min  Charges:  $Therapeutic Activity: 8-22 mins                     Natalie Berg , PTA Acute Rehabilitation Services Pager [phone] Office [phone]     Natalie Berg Artis Delay 11/05/2020, 5:12 PM

## 2020-11-15 ENCOUNTER — Encounter (HOSPITAL_COMMUNITY): Payer: Self-pay | Admitting: Orthopedic Surgery

## 2020-11-15 ENCOUNTER — Ambulatory Visit: Payer: Self-pay | Admitting: Orthopedic Surgery

## 2020-11-15 MED ORDER — LACTATED RINGERS IV SOLN
INTRAVENOUS | Status: DC
Start: 2020-11-15 — End: 2020-11-16

## 2020-11-15 MED ORDER — CHLORHEXIDINE GLUCONATE 0.12 % MT SOLN: 15.0000 mL | Freq: Once | OROMUCOSAL | Status: AC

## 2020-11-15 NOTE — Progress Notes (Signed)
Ms. Natalie Berg denies chest pain or shortness of breath. Patient will be tested for Covid in am- does not have transporttation today.
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
## 2020-11-16 ENCOUNTER — Inpatient Hospital Stay (HOSPITAL_COMMUNITY): Payer: Medicare Other

## 2020-11-16 ENCOUNTER — Inpatient Hospital Stay (HOSPITAL_COMMUNITY): Payer: Medicare Other | Admitting: Anesthesiology

## 2020-11-16 ENCOUNTER — Inpatient Hospital Stay (HOSPITAL_COMMUNITY)
Admission: RE | Admit: 2020-11-16 | Discharge: 2020-11-19 | DRG: 488 | Disposition: A | Discharge status: Home / Self Care | Payer: Medicare Other | Attending: Orthopedic Surgery | Admitting: Orthopedic Surgery

## 2020-11-16 ENCOUNTER — Encounter (HOSPITAL_COMMUNITY)
Admission: RE | Disposition: A | Discharge status: Home / Self Care | Payer: Self-pay | Source: Home / Self Care | Attending: Orthopedic Surgery

## 2020-11-16 ENCOUNTER — Other Ambulatory Visit: Payer: Self-pay

## 2020-11-16 ENCOUNTER — Encounter (HOSPITAL_COMMUNITY): Payer: Self-pay | Admitting: Orthopedic Surgery

## 2020-11-16 DIAGNOSIS — S82142A Displaced bicondylar fracture of left tibia, initial encounter for closed fracture: Secondary | ICD-10-CM | POA: Diagnosis present

## 2020-11-16 DIAGNOSIS — T8489XA Other specified complication of internal orthopedic prosthetic devices, implants and grafts, initial encounter: Secondary | ICD-10-CM | POA: Diagnosis present

## 2020-11-16 DIAGNOSIS — I341 Nonrheumatic mitral (valve) prolapse: Secondary | ICD-10-CM | POA: Diagnosis present

## 2020-11-16 DIAGNOSIS — M1990 Unspecified osteoarthritis, unspecified site: Secondary | ICD-10-CM | POA: Diagnosis present

## 2020-11-16 DIAGNOSIS — G8929 Other chronic pain: Secondary | ICD-10-CM | POA: Diagnosis present

## 2020-11-16 DIAGNOSIS — Z96651 Presence of right artificial knee joint: Secondary | ICD-10-CM | POA: Diagnosis present

## 2020-11-16 DIAGNOSIS — Z981 Arthrodesis status: Secondary | ICD-10-CM | POA: Diagnosis not present

## 2020-11-16 DIAGNOSIS — W109XXA Fall (on) (from) unspecified stairs and steps, initial encounter: Secondary | ICD-10-CM | POA: Diagnosis present

## 2020-11-16 DIAGNOSIS — Z841 Family history of disorders of kidney and ureter: Secondary | ICD-10-CM

## 2020-11-16 DIAGNOSIS — E8889 Other specified metabolic disorders: Secondary | ICD-10-CM | POA: Diagnosis present

## 2020-11-16 DIAGNOSIS — D62 Acute posthemorrhagic anemia: Secondary | ICD-10-CM | POA: Diagnosis not present

## 2020-11-16 DIAGNOSIS — Z419 Encounter for procedure for purposes other than remedying health state, unspecified: Secondary | ICD-10-CM

## 2020-11-16 DIAGNOSIS — K219 Gastro-esophageal reflux disease without esophagitis: Secondary | ICD-10-CM | POA: Diagnosis present

## 2020-11-16 DIAGNOSIS — Z91040 Latex allergy status: Secondary | ICD-10-CM | POA: Diagnosis not present

## 2020-11-16 DIAGNOSIS — Z825 Family history of asthma and other chronic lower respiratory diseases: Secondary | ICD-10-CM | POA: Diagnosis not present

## 2020-11-16 DIAGNOSIS — Z833 Family history of diabetes mellitus: Secondary | ICD-10-CM

## 2020-11-16 DIAGNOSIS — F32A Depression, unspecified: Secondary | ICD-10-CM | POA: Diagnosis present

## 2020-11-16 DIAGNOSIS — Y793 Surgical instruments, materials and orthopedic devices (including sutures) associated with adverse incidents: Secondary | ICD-10-CM | POA: Diagnosis present

## 2020-11-16 DIAGNOSIS — M80062A Age-related osteoporosis with current pathological fracture, left lower leg, initial encounter for fracture: Secondary | ICD-10-CM | POA: Diagnosis present

## 2020-11-16 DIAGNOSIS — S83282A Other tear of lateral meniscus, current injury, left knee, initial encounter: Secondary | ICD-10-CM | POA: Diagnosis present

## 2020-11-16 DIAGNOSIS — J45909 Unspecified asthma, uncomplicated: Secondary | ICD-10-CM | POA: Diagnosis present

## 2020-11-16 DIAGNOSIS — G4700 Insomnia, unspecified: Secondary | ICD-10-CM | POA: Diagnosis present

## 2020-11-16 DIAGNOSIS — Z96619 Presence of unspecified artificial shoulder joint: Secondary | ICD-10-CM | POA: Diagnosis present

## 2020-11-16 DIAGNOSIS — L97826 Non-pressure chronic ulcer of other part of left lower leg with bone involvement without evidence of necrosis: Secondary | ICD-10-CM | POA: Diagnosis present

## 2020-11-16 DIAGNOSIS — F419 Anxiety disorder, unspecified: Secondary | ICD-10-CM | POA: Diagnosis present

## 2020-11-16 DIAGNOSIS — T148XXA Other injury of unspecified body region, initial encounter: Secondary | ICD-10-CM

## 2020-11-16 DIAGNOSIS — Z20822 Contact with and (suspected) exposure to covid-19: Secondary | ICD-10-CM | POA: Diagnosis present

## 2020-11-16 DIAGNOSIS — Z8249 Family history of ischemic heart disease and other diseases of the circulatory system: Secondary | ICD-10-CM | POA: Diagnosis not present

## 2020-11-16 DIAGNOSIS — M549 Dorsalgia, unspecified: Secondary | ICD-10-CM | POA: Diagnosis present

## 2020-11-16 HISTORY — DX: Unspecified asthma, uncomplicated: J45.909

## 2020-11-16 HISTORY — DX: Ventricular premature depolarization: I49.3

## 2020-11-16 HISTORY — DX: Unspecified osteoarthritis, unspecified site: M19.90

## 2020-11-16 HISTORY — DX: Post-traumatic stress disorder, unspecified: F43.10

## 2020-11-16 HISTORY — PX: ORIF TIBIA PLATEAU: SHX2132

## 2020-11-16 LAB — CBC WITH DIFFERENTIAL/PLATELET
Abs Immature Granulocytes: 0.02 10*3/uL (ref 0.00–0.07)
Basophils Absolute: 0.1 10*3/uL (ref 0.0–0.1)
Basophils Relative: 1 %
Eosinophils Absolute: 0.2 10*3/uL (ref 0.0–0.5)
Eosinophils Relative: 3 %
HCT: 35.8 % — ABNORMAL LOW (ref 36.0–46.0)
Hemoglobin: 10.9 g/dL — ABNORMAL LOW (ref 12.0–15.0)
Immature Granulocytes: 0 %
Lymphocytes Relative: 27 %
Lymphs Abs: 2.1 10*3/uL (ref 0.7–4.0)
MCH: 24.8 pg — ABNORMAL LOW (ref 26.0–34.0)
MCHC: 30.4 g/dL (ref 30.0–36.0)
MCV: 81.5 fL (ref 80.0–100.0)
Monocytes Absolute: 0.5 10*3/uL (ref 0.1–1.0)
Monocytes Relative: 6 %
Neutro Abs: 4.8 10*3/uL (ref 1.7–7.7)
Neutrophils Relative %: 63 %
Platelets: 545 10*3/uL — ABNORMAL HIGH (ref 150–400)
RBC: 4.39 MIL/uL (ref 3.87–5.11)
RDW: 16.9 % — ABNORMAL HIGH (ref 11.5–15.5)
WBC: 7.7 10*3/uL (ref 4.0–10.5)
nRBC: 0 % (ref 0.0–0.2)

## 2020-11-16 LAB — TYPE AND SCREEN
ABO/RH(D): O POS
Antibody Screen: NEGATIVE

## 2020-11-16 LAB — COMPREHENSIVE METABOLIC PANEL
ALT: 12 U/L (ref 0–44)
AST: 17 U/L (ref 15–41)
Albumin: 3.8 g/dL (ref 3.5–5.0)
Alkaline Phosphatase: 115 U/L (ref 38–126)
Anion gap: 11 (ref 5–15)
BUN: 14 mg/dL (ref 6–20)
CO2: 22 mmol/L (ref 22–32)
Calcium: 9.5 mg/dL (ref 8.9–10.3)
Chloride: 101 mmol/L (ref 98–111)
Creatinine, Ser: 0.84 mg/dL (ref 0.44–1.00)
GFR, Estimated: >60 mL/min (ref >60)
Glucose, Bld: 106 mg/dL — ABNORMAL HIGH (ref 70–99)
Potassium: 3.8 mmol/L (ref 3.5–5.1)
Sodium: 134 mmol/L — ABNORMAL LOW (ref 135–145)
Total Bilirubin: 0.7 mg/dL (ref 0.3–1.2)
Total Protein: 7.7 g/dL (ref 6.5–8.1)

## 2020-11-16 LAB — PROTIME-INR
INR: 1.1 (ref 0.8–1.2)
Prothrombin Time: 14.1 seconds (ref 11.4–15.2)

## 2020-11-16 LAB — VITAMIN D 25 HYDROXY (VIT D DEFICIENCY, FRACTURES): Vit D, 25-Hydroxy: 17.46 ng/mL — ABNORMAL LOW (ref 30–100)

## 2020-11-16 LAB — SARS CORONAVIRUS 2 BY RT PCR (HOSPITAL ORDER, PERFORMED IN ~~LOC~~ HOSPITAL LAB): SARS Coronavirus 2: NEGATIVE

## 2020-11-16 LAB — APTT: aPTT: 31 seconds (ref 24–36)

## 2020-11-16 SURGERY — OPEN REDUCTION INTERNAL FIXATION (ORIF) TIBIAL PLATEAU
Anesthesia: General | Site: Leg Lower | Laterality: Left

## 2020-11-16 MED ORDER — CEFAZOLIN SODIUM-DEXTROSE 2-4 GM/100ML-% IV SOLN
INTRAVENOUS | Status: AC
  Filled 2020-11-16: qty 100

## 2020-11-16 MED ORDER — ENOXAPARIN SODIUM 40 MG/0.4ML IJ SOSY
40.0000 mg | PREFILLED_SYRINGE | INTRAMUSCULAR | Status: DC
  Administered 2020-11-17 – 2020-11-19 (×3): 40 mg via SUBCUTANEOUS
  Filled 2020-11-16 (×3): qty 0.4

## 2020-11-16 MED ORDER — MIDAZOLAM HCL 2 MG/2ML IJ SOLN
INTRAMUSCULAR | Status: AC
  Filled 2020-11-16: qty 2

## 2020-11-16 MED ORDER — OXYCODONE HCL 5 MG PO TABS
ORAL_TABLET | ORAL | Status: AC
  Filled 2020-11-16: qty 1

## 2020-11-16 MED ORDER — PROMETHAZINE HCL 25 MG/ML IJ SOLN: 6.2500 mg | INTRAMUSCULAR | Status: DC | PRN

## 2020-11-16 MED ORDER — ONDANSETRON HCL 4 MG PO TABS: 4.0000 mg | ORAL_TABLET | Freq: Four times a day (QID) | ORAL | Status: DC | PRN

## 2020-11-16 MED ORDER — LIDOCAINE 2% (20 MG/ML) 5 ML SYRINGE
INTRAMUSCULAR | Status: DC | PRN
  Administered 2020-11-16: 100 mg via INTRAVENOUS

## 2020-11-16 MED ORDER — VITAMIN D 25 MCG (1000 UNIT) PO TABS
2000.0000 [IU] | ORAL_TABLET | Freq: Two times a day (BID) | ORAL | Status: DC
  Administered 2020-11-16 – 2020-11-19 (×6): 2000 [IU] via ORAL
  Filled 2020-11-16 (×6): qty 2

## 2020-11-16 MED ORDER — METOCLOPRAMIDE HCL 5 MG/ML IJ SOLN: 5.0000 mg | Freq: Three times a day (TID) | INTRAMUSCULAR | Status: DC | PRN

## 2020-11-16 MED ORDER — HYDROMORPHONE HCL 1 MG/ML IJ SOLN
INTRAMUSCULAR | Status: AC
  Filled 2020-11-16: qty 1

## 2020-11-16 MED ORDER — METHOCARBAMOL 500 MG PO TABS
ORAL_TABLET | ORAL | Status: AC
  Filled 2020-11-16: qty 1

## 2020-11-16 MED ORDER — HYDROCODONE-ACETAMINOPHEN 5-325 MG PO TABS
1.0000 | ORAL_TABLET | Freq: Four times a day (QID) | ORAL | Status: DC | PRN
  Administered 2020-11-17 – 2020-11-19 (×7): 2 via ORAL
  Filled 2020-11-16 (×7): qty 2

## 2020-11-16 MED ORDER — HYDROCODONE-ACETAMINOPHEN 7.5-325 MG PO TABS
1.0000 | ORAL_TABLET | Freq: Four times a day (QID) | ORAL | Status: DC | PRN
  Filled 2020-11-16: qty 2

## 2020-11-16 MED ORDER — POLYETHYLENE GLYCOL 3350 17 G PO PACK
17.0000 g | PACK | Freq: Every day | ORAL | Status: DC
  Administered 2020-11-19: 17 g via ORAL
  Filled 2020-11-16 (×3): qty 1

## 2020-11-16 MED ORDER — ROCURONIUM BROMIDE 10 MG/ML (PF) SYRINGE
PREFILLED_SYRINGE | INTRAVENOUS | Status: DC | PRN
  Administered 2020-11-16: 30 mg via INTRAVENOUS
  Administered 2020-11-16: 20 mg via INTRAVENOUS
  Administered 2020-11-16: 70 mg via INTRAVENOUS

## 2020-11-16 MED ORDER — BISACODYL 5 MG PO TBEC: 5.0000 mg | DELAYED_RELEASE_TABLET | Freq: Every day | ORAL | Status: DC | PRN

## 2020-11-16 MED ORDER — CHLORHEXIDINE GLUCONATE 4 % EX LIQD: 60.0000 mL | Freq: Once | CUTANEOUS | Status: DC

## 2020-11-16 MED ORDER — FENTANYL CITRATE (PF) 250 MCG/5ML IJ SOLN
INTRAMUSCULAR | Status: AC
  Filled 2020-11-16: qty 5

## 2020-11-16 MED ORDER — OXYCODONE HCL 5 MG PO TABS
5.0000 mg | ORAL_TABLET | Freq: Once | ORAL | Status: AC | PRN
Start: 2020-11-16 — End: 2020-11-16
  Administered 2020-11-16: 5 mg via ORAL

## 2020-11-16 MED ORDER — OXYCODONE HCL 5 MG/5ML PO SOLN: 5.0000 mg | Freq: Once | ORAL | Status: AC | PRN

## 2020-11-16 MED ORDER — POTASSIUM CHLORIDE IN NACL 20-0.9 MEQ/L-% IV SOLN
INTRAVENOUS | Status: DC
  Filled 2020-11-16 (×4): qty 1000

## 2020-11-16 MED ORDER — METHOCARBAMOL 750 MG PO TABS
750.0000 mg | ORAL_TABLET | Freq: Three times a day (TID) | ORAL | Status: DC
  Administered 2020-11-16 – 2020-11-19 (×9): 750 mg via ORAL
  Filled 2020-11-16 (×11): qty 1

## 2020-11-16 MED ORDER — POVIDONE-IODINE 10 % EX SWAB
2.0000 "application " | Freq: Once | CUTANEOUS | Status: AC
  Administered 2020-11-16: 2 via TOPICAL

## 2020-11-16 MED ORDER — LORAZEPAM 1 MG PO TABS
1.0000 mg | ORAL_TABLET | Freq: Four times a day (QID) | ORAL | Status: DC | PRN
  Administered 2020-11-16 – 2020-11-19 (×6): 1 mg via ORAL
  Filled 2020-11-16 (×6): qty 1

## 2020-11-16 MED ORDER — ALBUTEROL SULFATE HFA 108 (90 BASE) MCG/ACT IN AERS
INHALATION_SPRAY | RESPIRATORY_TRACT | Status: AC
  Filled 2020-11-16: qty 6.7

## 2020-11-16 MED ORDER — ONDANSETRON HCL 4 MG/2ML IJ SOLN: 4.0000 mg | Freq: Four times a day (QID) | INTRAMUSCULAR | Status: DC | PRN

## 2020-11-16 MED ORDER — ALBUTEROL SULFATE (2.5 MG/3ML) 0.083% IN NEBU: 3.0000 mL | INHALATION_SOLUTION | Freq: Four times a day (QID) | RESPIRATORY_TRACT | Status: DC | PRN

## 2020-11-16 MED ORDER — METOCLOPRAMIDE HCL 5 MG PO TABS: 5.0000 mg | ORAL_TABLET | Freq: Three times a day (TID) | ORAL | Status: DC | PRN

## 2020-11-16 MED ORDER — PANTOPRAZOLE SODIUM 40 MG PO TBEC
40.0000 mg | DELAYED_RELEASE_TABLET | Freq: Every day | ORAL | Status: DC
  Administered 2020-11-17 – 2020-11-19 (×3): 40 mg via ORAL
  Filled 2020-11-16 (×3): qty 1

## 2020-11-16 MED ORDER — ASCORBIC ACID 500 MG PO TABS
500.0000 mg | ORAL_TABLET | Freq: Every day | ORAL | Status: DC
  Administered 2020-11-16 – 2020-11-19 (×4): 500 mg via ORAL
  Filled 2020-11-16 (×4): qty 1

## 2020-11-16 MED ORDER — DULOXETINE HCL 60 MG PO CPEP
60.0000 mg | ORAL_CAPSULE | Freq: Two times a day (BID) | ORAL | Status: DC
  Administered 2020-11-16 – 2020-11-19 (×6): 60 mg via ORAL
  Filled 2020-11-16 (×6): qty 1

## 2020-11-16 MED ORDER — FENTANYL CITRATE (PF) 100 MCG/2ML IJ SOLN
INTRAMUSCULAR | Status: DC | PRN
  Administered 2020-11-16 (×3): 50 ug via INTRAVENOUS
  Administered 2020-11-16: 100 ug via INTRAVENOUS

## 2020-11-16 MED ORDER — HYDROMORPHONE HCL 1 MG/ML IJ SOLN
INTRAMUSCULAR | Status: AC
  Filled 2020-11-16: qty 0.5

## 2020-11-16 MED ORDER — PHENYLEPHRINE 40 MCG/ML (10ML) SYRINGE FOR IV PUSH (FOR BLOOD PRESSURE SUPPORT)
PREFILLED_SYRINGE | INTRAVENOUS | Status: DC | PRN
  Administered 2020-11-16: 80 ug via INTRAVENOUS

## 2020-11-16 MED ORDER — PROPOFOL 10 MG/ML IV BOLUS
INTRAVENOUS | Status: AC
  Filled 2020-11-16: qty 20

## 2020-11-16 MED ORDER — ACETAMINOPHEN 325 MG PO TABS: 325.0000 mg | ORAL_TABLET | Freq: Four times a day (QID) | ORAL | Status: DC | PRN

## 2020-11-16 MED ORDER — KETOROLAC TROMETHAMINE 15 MG/ML IJ SOLN
15.0000 mg | Freq: Four times a day (QID) | INTRAMUSCULAR | Status: DC
  Administered 2020-11-17 – 2020-11-19 (×11): 15 mg via INTRAVENOUS
  Filled 2020-11-16 (×11): qty 1

## 2020-11-16 MED ORDER — HYDROMORPHONE HCL 1 MG/ML IJ SOLN
INTRAMUSCULAR | Status: DC | PRN
  Administered 2020-11-16: .5 mg via INTRAVENOUS

## 2020-11-16 MED ORDER — KETOROLAC TROMETHAMINE 30 MG/ML IJ SOLN
30.0000 mg | Freq: Once | INTRAMUSCULAR | Status: AC | PRN
  Administered 2020-11-16: 30 mg via INTRAVENOUS

## 2020-11-16 MED ORDER — DOCUSATE SODIUM 100 MG PO CAPS
100.0000 mg | ORAL_CAPSULE | Freq: Two times a day (BID) | ORAL | Status: DC
  Administered 2020-11-16 – 2020-11-19 (×2): 100 mg via ORAL
  Filled 2020-11-16 (×6): qty 1

## 2020-11-16 MED ORDER — CLEVIDIPINE BUTYRATE 0.5 MG/ML IV EMUL
1.0000 mg/h | INTRAVENOUS | Status: AC
  Administered 2020-11-16: 2 mg/h via INTRAVENOUS
  Filled 2020-11-16: qty 50

## 2020-11-16 MED ORDER — MIDAZOLAM HCL 5 MG/5ML IJ SOLN
INTRAMUSCULAR | Status: DC | PRN
  Administered 2020-11-16: 2 mg via INTRAVENOUS

## 2020-11-16 MED ORDER — CEFAZOLIN SODIUM-DEXTROSE 2-4 GM/100ML-% IV SOLN
2.0000 g | INTRAVENOUS | Status: AC
  Administered 2020-11-16: 2 g via INTRAVENOUS

## 2020-11-16 MED ORDER — HYDROMORPHONE HCL 1 MG/ML IJ SOLN
0.2500 mg | INTRAMUSCULAR | Status: DC | PRN
  Administered 2020-11-16 (×2): 0.5 mg via INTRAVENOUS

## 2020-11-16 MED ORDER — VITAMIN D (ERGOCALCIFEROL) 1.25 MG (50000 UNIT) PO CAPS
50000.0000 [IU] | ORAL_CAPSULE | ORAL | Status: DC
  Administered 2020-11-17: 50000 [IU] via ORAL
  Filled 2020-11-16: qty 1

## 2020-11-16 MED ORDER — ACETAMINOPHEN 500 MG PO TABS
500.0000 mg | ORAL_TABLET | Freq: Two times a day (BID) | ORAL | Status: AC
Start: 2020-11-16 — End: 2020-11-18
  Administered 2020-11-16 – 2020-11-18 (×4): 500 mg via ORAL
  Filled 2020-11-16 (×4): qty 1

## 2020-11-16 MED ORDER — SUGAMMADEX SODIUM 200 MG/2ML IV SOLN
INTRAVENOUS | Status: DC | PRN
  Administered 2020-11-16: 200 mg via INTRAVENOUS

## 2020-11-16 MED ORDER — CHLORHEXIDINE GLUCONATE 0.12 % MT SOLN
OROMUCOSAL | Status: AC
  Administered 2020-11-16: 15 mL via OROMUCOSAL
  Filled 2020-11-16: qty 15

## 2020-11-16 MED ORDER — ONDANSETRON HCL 4 MG/2ML IJ SOLN
INTRAMUSCULAR | Status: DC | PRN
  Administered 2020-11-16: 4 mg via INTRAVENOUS

## 2020-11-16 MED ORDER — MORPHINE SULFATE (PF) 2 MG/ML IV SOLN
0.5000 mg | INTRAVENOUS | Status: DC | PRN
  Administered 2020-11-16 – 2020-11-17 (×3): 1 mg via INTRAVENOUS
  Filled 2020-11-16 (×3): qty 1

## 2020-11-16 MED ORDER — METHOCARBAMOL 1000 MG/10ML IJ SOLN
500.0000 mg | Freq: Three times a day (TID) | INTRAMUSCULAR | Status: DC
  Filled 2020-11-16: qty 5

## 2020-11-16 MED ORDER — CEFAZOLIN SODIUM 1 G IJ SOLR
1.0000 g | Freq: Four times a day (QID) | INTRAMUSCULAR | Status: AC
  Administered 2020-11-16 – 2020-11-17 (×3): 1 g via INTRAVENOUS
  Filled 2020-11-16 (×3): qty 10

## 2020-11-16 MED ORDER — ALBUTEROL SULFATE HFA 108 (90 BASE) MCG/ACT IN AERS
INHALATION_SPRAY | RESPIRATORY_TRACT | Status: DC | PRN
  Administered 2020-11-16: 8 via RESPIRATORY_TRACT

## 2020-11-16 MED ORDER — KETOROLAC TROMETHAMINE 30 MG/ML IJ SOLN
INTRAMUSCULAR | Status: AC
  Filled 2020-11-16: qty 1

## 2020-11-16 MED ORDER — PROPOFOL 10 MG/ML IV BOLUS
INTRAVENOUS | Status: DC | PRN
  Administered 2020-11-16: 150 mg via INTRAVENOUS

## 2020-11-16 SURGICAL SUPPLY — 91 items
BANDAGE ESMARK 6X9 LF (GAUZE/BANDAGES/DRESSINGS) ×1 IMPLANT
BIT DRILL 100X2.5XANTM LCK (BIT) IMPLANT
BIT DRILL CAL (BIT) IMPLANT
BIT DRL 100X2.5XANTM LCK (BIT) ×1
BLADE CLIPPER SURG (BLADE) IMPLANT
BLADE SURG 10 STRL SS (BLADE) ×2 IMPLANT
BLADE SURG 15 STRL LF DISP TIS (BLADE) ×1 IMPLANT
BLADE SURG 15 STRL SS (BLADE) ×2
BNDG CMPR 9X6 STRL LF SNTH (GAUZE/BANDAGES/DRESSINGS) ×1
BNDG COHESIVE 4X5 TAN STRL (GAUZE/BANDAGES/DRESSINGS) ×2 IMPLANT
BNDG ELASTIC 4X5.8 VLCR STR LF (GAUZE/BANDAGES/DRESSINGS) ×2 IMPLANT
BNDG ELASTIC 6X5.8 VLCR STR LF (GAUZE/BANDAGES/DRESSINGS) ×2 IMPLANT
BNDG ESMARK 6X9 LF (GAUZE/BANDAGES/DRESSINGS) ×2
BNDG GAUZE ELAST 4 BULKY (GAUZE/BANDAGES/DRESSINGS) ×2 IMPLANT
BONE CANC CHIPS 40CC CAN1/2 (Bone Implant) ×2 IMPLANT
BRUSH SCRUB EZ PLAIN DRY (MISCELLANEOUS) ×3 IMPLANT
CANISTER SUCT 3000ML PPV (MISCELLANEOUS) ×2 IMPLANT
CHIPS CANC BONE 40CC CAN1/2 (Bone Implant) ×1 IMPLANT
COVER SURGICAL LIGHT HANDLE (MISCELLANEOUS) ×2 IMPLANT
COVER WAND RF STERILE (DRAPES) ×2 IMPLANT
CUFF TOURN SGL QUICK 34 (TOURNIQUET CUFF) ×2
CUFF TRNQT CYL 34X4.125X (TOURNIQUET CUFF) ×1 IMPLANT
DRAPE C-ARM 42X72 X-RAY (DRAPES) ×2 IMPLANT
DRAPE C-ARMOR (DRAPES) ×2 IMPLANT
DRAPE HALF SHEET 40X57 (DRAPES) ×2 IMPLANT
DRAPE INCISE IOBAN 66X45 STRL (DRAPES) ×2 IMPLANT
DRAPE U-SHAPE 47X51 STRL (DRAPES) ×2 IMPLANT
DRILL BIT 2.5MM (BIT) ×2
DRILL BIT CAL (BIT) ×2
DRSG ADAPTIC 3X8 NADH LF (GAUZE/BANDAGES/DRESSINGS) ×2 IMPLANT
DRSG PAD ABDOMINAL 8X10 ST (GAUZE/BANDAGES/DRESSINGS) ×4 IMPLANT
ELECT REM PT RETURN 9FT ADLT (ELECTROSURGICAL) ×2
ELECTRODE REM PT RTRN 9FT ADLT (ELECTROSURGICAL) ×1 IMPLANT
GAUZE SPONGE 4X4 12PLY STRL (GAUZE/BANDAGES/DRESSINGS) ×2 IMPLANT
GLOVE BIO SURGEON STRL SZ7.5 (GLOVE) ×2 IMPLANT
GLOVE BIO SURGEON STRL SZ8 (GLOVE) ×2 IMPLANT
GLOVE BIOGEL PI IND STRL 7.5 (GLOVE) ×1 IMPLANT
GLOVE BIOGEL PI INDICATOR 7.5 (GLOVE) ×1
GLOVE SRG 8 PF TXTR STRL LF DI (GLOVE) ×1 IMPLANT
GLOVE SURG UNDER POLY LF SZ8 (GLOVE) ×2
GOWN STRL REUS W/ TWL LRG LVL3 (GOWN DISPOSABLE) ×2 IMPLANT
GOWN STRL REUS W/ TWL XL LVL3 (GOWN DISPOSABLE) ×1 IMPLANT
GOWN STRL REUS W/TWL LRG LVL3 (GOWN DISPOSABLE) ×4
GOWN STRL REUS W/TWL XL LVL3 (GOWN DISPOSABLE) ×2
GRAFT BNE CHIP CANC 1-8 40 (Bone Implant) IMPLANT
HEMOSTAT ARISTA ABSORB 3G PWDR (HEMOSTASIS) ×1 IMPLANT
IMMOBILIZER KNEE 22 UNIV (SOFTGOODS) ×2 IMPLANT
K-WIRE ACE 1.6X6 (WIRE) ×4
KIT BASIN OR (CUSTOM PROCEDURE TRAY) ×2 IMPLANT
KIT TURNOVER KIT B (KITS) ×2 IMPLANT
KWIRE ACE 1.6X6 (WIRE) IMPLANT
NDL SUT 6 .5 CRC .975X.05 MAYO (NEEDLE) IMPLANT
NEEDLE MAYO TAPER (NEEDLE) ×2
NS IRRIG 1000ML POUR BTL (IV SOLUTION) ×2 IMPLANT
PACK ORTHO EXTREMITY (CUSTOM PROCEDURE TRAY) ×2 IMPLANT
PAD ARMBOARD 7.5X6 YLW CONV (MISCELLANEOUS) ×4 IMPLANT
PAD CAST 4YDX4 CTTN HI CHSV (CAST SUPPLIES) ×1 IMPLANT
PADDING CAST COTTON 4X4 STRL (CAST SUPPLIES) ×2
PADDING CAST COTTON 6X4 STRL (CAST SUPPLIES) ×2 IMPLANT
PLATE LOCK 7H STD LT PROX TIB (Plate) ×1 IMPLANT
SCREW CORTICAL 3.5MM  34MM (Screw) ×2 IMPLANT
SCREW CORTICAL 3.5MM 34MM (Screw) IMPLANT
SCREW CORTICAL 3.5MM 36MM (Screw) ×2 IMPLANT
SCREW CORTICAL 3.5MM 38MM (Screw) ×1 IMPLANT
SCREW LOCK CORT STAR 3.5X56 (Screw) ×1 IMPLANT
SCREW LOCK CORT STAR 3.5X60 (Screw) ×1 IMPLANT
SCREW LOCK CORT STAR 3.5X65 (Screw) ×1 IMPLANT
SCREW LOCK CORT STAR 3.5X70 (Screw) ×5 IMPLANT
SCREW LOW PROF CORTICAL 3.5X80 (Screw) ×1 IMPLANT
SCREW LP 3.5X70MM (Screw) ×1 IMPLANT
SPONGE LAP 18X18 RF (DISPOSABLE) ×2 IMPLANT
STAPLER VISISTAT 35W (STAPLE) ×2 IMPLANT
STOCKINETTE IMPERVIOUS LG (DRAPES) ×2 IMPLANT
SUCTION FRAZIER HANDLE 10FR (MISCELLANEOUS) ×2
SUCTION TUBE FRAZIER 10FR DISP (MISCELLANEOUS) ×1 IMPLANT
SUT ETHILON 2 0 FS 18 (SUTURE) ×2 IMPLANT
SUT ETHILON 3 0 PS 1 (SUTURE) ×2 IMPLANT
SUT PROLENE 0 CT 2 (SUTURE) ×4 IMPLANT
SUT VIC AB 0 CT1 27 (SUTURE) ×4
SUT VIC AB 0 CT1 27XBRD ANBCTR (SUTURE) ×1 IMPLANT
SUT VIC AB 1 CT1 27 (SUTURE) ×42
SUT VIC AB 1 CT1 27XBRD ANBCTR (SUTURE) ×1 IMPLANT
SUT VIC AB 2-0 CT1 27 (SUTURE) ×4
SUT VIC AB 2-0 CT1 TAPERPNT 27 (SUTURE) ×2 IMPLANT
SYR 5ML LL (SYRINGE) ×3 IMPLANT
TOWEL GREEN STERILE (TOWEL DISPOSABLE) ×4 IMPLANT
TOWEL GREEN STERILE FF (TOWEL DISPOSABLE) ×2 IMPLANT
TRAY FOLEY MTR SLVR 16FR STAT (SET/KITS/TRAYS/PACK) IMPLANT
TUBE CONNECTING 12X1/4 (SUCTIONS) ×2 IMPLANT
WATER STERILE IRR 1000ML POUR (IV SOLUTION) ×4 IMPLANT
YANKAUER SUCT BULB TIP NO VENT (SUCTIONS) ×2 IMPLANT

## 2020-11-16 NOTE — Anesthesia Preprocedure Evaluation (Signed)
Anesthesia Evaluation  Patient identified by MRN, date of birth, ID band Patient awake    Reviewed: Allergy & Precautions, NPO status , Patient's Chart, lab work & pertinent test results  Airway Mallampati: II  TM Distance: >3 FB Neck ROM: Full    Dental no notable dental hx.    Pulmonary asthma ,    Pulmonary exam normal breath sounds clear to auscultation       Cardiovascular negative cardio ROS Normal cardiovascular exam Rhythm:Regular Rate:Normal     Neuro/Psych negative neurological ROS  negative psych ROS   GI/Hepatic Neg liver ROS, GERD  Medicated,  Endo/Other  negative endocrine ROS  Renal/GU negative Renal ROS  negative genitourinary   Musculoskeletal negative musculoskeletal ROS (+)   Abdominal   Peds negative pediatric ROS (+)  Hematology  (+) anemia ,   Anesthesia Other Findings   Reproductive/Obstetrics negative OB ROS                             Anesthesia Physical Anesthesia Plan  ASA: II  Anesthesia Plan: General   Post-op Pain Management:    Induction: Intravenous  PONV Risk Score and Plan: 3 and Ondansetron, Dexamethasone, Treatment may vary due to age or medical condition and Midazolam  Airway Management Planned: Oral ETT  Additional Equipment:   Intra-op Plan:   Post-operative Plan: Extubation in OR  Informed Consent: I have reviewed the patients History and Physical, chart, labs and discussed the procedure including the risks, benefits and alternatives for the proposed anesthesia with the patient or authorized representative who has indicated his/her understanding and acceptance.     Dental advisory given  Plan Discussed with: CRNA and Surgeon  Anesthesia Plan Comments:        Anesthesia Quick Evaluation
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
## 2020-11-16 NOTE — Progress Notes (Signed)
Orthopedic Tech Progress Note Patient Details:  Natalie Berg 05/10/1971 [id number]  Patient ID: [name], female   DOB: 1970/08/30, 50 y.o.   MRN: [id number]   Kizzie Fantasia 11/16/2020, 8:38 PM Outside vender brace ordered from [employer] @2035 .

## 2020-11-16 NOTE — Progress Notes (Signed)
Patient had MEWS score of 2 when admited from PACU. New set of Vital was obtained. MEWS Score of 1. Pt. Is alert and oriented. No complaint of pain, nausea or vomiting.Will monitor.

## 2020-11-16 NOTE — Anesthesia Procedure Notes (Signed)
Procedure Name: Intubation Date/Time: 11/16/2020 2:25 PM Performed by: Lynnell Chad, CRNA Pre-anesthesia Checklist: Patient identified, Emergency Drugs available, Suction available and Patient being monitored Patient Re-evaluated:Patient Re-evaluated prior to induction Oxygen Delivery Method: Circle System Utilized Preoxygenation: Pre-oxygenation with 100% oxygen Induction Type: IV induction Ventilation: Mask ventilation without difficulty Laryngoscope Size: Miller and 3 Grade View: Grade I Tube type: Oral Tube size: 7.0 mm Number of attempts: 1 Airway Equipment and Method: Stylet and Oral airway Placement Confirmation: ETT inserted through vocal cords under direct vision,  positive ETCO2 and breath sounds checked- equal and bilateral Secured at: 22 cm Tube secured with: Tape Dental Injury: Teeth and Oropharynx as per pre-operative assessment

## 2020-11-16 NOTE — Transfer of Care (Signed)
Immediate Anesthesia Transfer of Care Note  Patient: Natalie Berg  Procedure(s) Performed: OPEN REDUCTION INTERNAL FIXATION (ORIF) TIBIAL PLATEAU REMOVE EXTERNAL FIXATOR (Left Leg Lower)  Patient Location: PACU  Anesthesia Type:General  Level of Consciousness: drowsy and patient cooperative  Airway & Oxygen Therapy: Patient Spontanous Breathing  Post-op Assessment: Report given to RN and Post -op Vital signs reviewed and stable  Post vital signs: Reviewed and stable  Last Vitals:  Vitals Value Taken Time  BP 161/88 11/16/20 1732  Temp    Pulse 111 11/16/20 1734  Resp 21 11/16/20 1734  SpO2 94 % 11/16/20 1734  Vitals shown include unvalidated device data.  Last Pain:  Vitals:   11/16/20 1045  TempSrc:   PainSc: 5       Patients Stated Pain Goal: 3 (11/16/20 1045)  Complications: No complications documented.

## 2020-11-17 LAB — CBC
HCT: 29.1 % — ABNORMAL LOW (ref 36.0–46.0)
Hemoglobin: 8.9 g/dL — ABNORMAL LOW (ref 12.0–15.0)
MCH: 24.8 pg — ABNORMAL LOW (ref 26.0–34.0)
MCHC: 30.6 g/dL (ref 30.0–36.0)
MCV: 81.1 fL (ref 80.0–100.0)
Platelets: 468 10*3/uL — ABNORMAL HIGH (ref 150–400)
RBC: 3.59 MIL/uL — ABNORMAL LOW (ref 3.87–5.11)
RDW: 16.7 % — ABNORMAL HIGH (ref 11.5–15.5)
WBC: 8.9 10*3/uL (ref 4.0–10.5)
nRBC: 0 % (ref 0.0–0.2)

## 2020-11-17 LAB — BASIC METABOLIC PANEL
Anion gap: 9 (ref 5–15)
BUN: 14 mg/dL (ref 6–20)
CO2: 26 mmol/L (ref 22–32)
Calcium: 8.7 mg/dL — ABNORMAL LOW (ref 8.9–10.3)
Chloride: 98 mmol/L (ref 98–111)
Creatinine, Ser: 0.93 mg/dL (ref 0.44–1.00)
GFR, Estimated: >60 mL/min (ref >60)
Glucose, Bld: 106 mg/dL — ABNORMAL HIGH (ref 70–99)
Potassium: 3.9 mmol/L (ref 3.5–5.1)
Sodium: 133 mmol/L — ABNORMAL LOW (ref 135–145)

## 2020-11-17 MED ORDER — ENSURE ENLIVE PO LIQD
237.0000 mL | Freq: Two times a day (BID) | ORAL | Status: DC
  Administered 2020-11-18 – 2020-11-19 (×2): 237 mL via ORAL

## 2020-11-17 MED ORDER — ADULT MULTIVITAMIN W/MINERALS CH
1.0000 | ORAL_TABLET | Freq: Every day | ORAL | Status: DC
  Administered 2020-11-17 – 2020-11-19 (×3): 1 via ORAL
  Filled 2020-11-17 (×3): qty 1

## 2020-11-17 NOTE — Progress Notes (Signed)
Patient ID: Natalie Berg, female   DOB: Nov 21, 1970, 50 y.o.   MRN: [id number]   ORTHOPAEDIC PROGRESS NOTE  s/p Procedure(s): OPEN REDUCTION INTERNAL FIXATION (ORIF) TIBIAL PLATEAU REMOVE EXTERNAL FIXATOR  SUBJECTIVE: Reports mild pain about operative site. No chest pain. No SOB. No nausea/vomiting. No other complaints.  2 + DP pulses   Good active range of motion of toes   No numbness or tinglint  OBJECTIVE: PE:  Vitals:   11/17/20 0441 11/17/20 0743  BP: 137/70 (!) 118/57  Pulse: 95 96  Resp: 15 17  Temp: 97.6 F (36.4 C) 98.7 F (37.1 C)  SpO2: 100% 96%     ASSESSMENT: Natalie Berg is a 50 y.o. female doing well postoperatively.  PLAN: Weightbearing: NWB LLE Insicional and dressing care: Dressings left intact until follow-up Orthopedic device(s): None Showering: do not get cast wet VTE prophylaxis: Lovenox 40mg  qd until weight bearing Pain control: well controlled Follow - up plan: 2 weeks  PT OT in hospital home Monday  Contact information:   Bluford Sedler A. Saturday Physician Assistant [facility] [phone]  11/17/2020, 8:49 AM

## 2020-11-17 NOTE — Evaluation (Signed)
Physical Therapy Evaluation Patient Details Name: Natalie Berg MRN: [id number] DOB: [birth year] Today's Date: 11/17/2020   History of Present Illness  50 y.o. female who fell at home (4/14)  and presented to the emergency room with a left medial plateau fracture. Patient s/p L LE spanning ex fix application on 4/16. She has now returned for an ORIF of the tibial paleau PMH: GERD, depression, anxiety, mitral valve prolapse, peripheral neuropathy.  Clinical Impression  Patient did well getting to the chair and maintaining her NWB. Her pain did not change with transfers. She worked on light ROM. Therapy educated patient on the importance of self ROM. She had a minimal quad contraction at this time but will continue to work on it in the chair and in bed. She has been non-weight bearing for 2 weeks at home. She had some difficulty getting in the house the first time but reports she will have enough help to get up the 3 steps. Skilled therapy will continue to follow her for stair training. She may also benefit from homehealth PT as well as 24 hour help at home.     Follow Up Recommendations Home health PT;Supervision/Assistance - 24 hour    Equipment Recommendations  None recommended by PT    Recommendations for Other Services       Precautions / Restrictions Precautions Precautions: Fall Restrictions Weight Bearing Restrictions: Yes LLE Weight Bearing: Non weight bearing      Mobility  Bed Mobility Overal bed mobility: Needs Assistance Bed Mobility: Supine to Sit     Supine to sit: Min assist     General bed mobility comments: min a to help left leg out of bed. Once to the edge of the bed indepdnent    Transfers Overall transfer level: Needs assistance Equipment used: Rolling walker (2 wheeled) Transfers: Sit to/from Stand Sit to Stand: Min assist         General transfer comment: min a for strength and inital balance  Ambulation/Gait Ambulation/Gait assistance: Min  guard Gait Distance (Feet): 4 Feet Assistive device: Rolling walker (2 wheeled) Gait Pattern/deviations: Step-to pattern;Decreased stride length Gait velocity: decreased   General Gait Details: Pt remains able to maintain TDWB, but still has minimal clearance of R LE at times. Still remains min guard for safety.  Stairs            Wheelchair Mobility    Modified Rankin (Stroke Patients Only)       Balance Overall balance assessment: Needs assistance Sitting-balance support: No upper extremity supported;Feet supported Sitting balance-Leahy Scale: Good     Standing balance support: Bilateral upper extremity supported;During functional activity Standing balance-Leahy Scale: Poor Standing balance comment: reliant on UE support                             Pertinent Vitals/Pain Pain Assessment: 0-10 Pain Score: 8  Pain Location: L LE Pain Descriptors / Indicators: Sore;Tender Pain Intervention(s): Monitored during session;Premedicated before session;Repositioned    Home Living Family/patient expects to be discharged to:: Private residence Living Arrangements: Alone Available Help at Discharge: Family;Available PRN/intermittently Type of Home: House Home Access: Stairs to enter Entrance Stairs-Rails: Left Entrance Stairs-Number of Steps: 2 Home Layout: One level Home Equipment: Grab bars - tub/shower;Wheelchair - manual;Grab bars - toilet;Walker - 2 wheels;Tub bench Additional Comments: nephew and brother can assist PRN    Prior Function Level of Independence: Independent         Comments:  drives, not working, travels between [place] and [employer] 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     Higher education careers adviser   Dominant Hand: Right    Extremity/Trunk Assessment   Upper Extremity Assessment Upper Extremity Assessment: Defer to OT evaluation    Lower Extremity Assessment Lower Extremity Assessment: LLE deficits/detail LLE Deficits / Details: minimal quad  set noted    Cervical / Trunk Assessment Cervical / Trunk Assessment: Normal  Communication   Communication: No difficulties  Cognition Arousal/Alertness: Awake/alert Behavior During Therapy: WFL for tasks assessed/performed Overall Cognitive Status: Within Functional Limits for tasks assessed                                        General Comments General comments (skin integrity, edema, etc.): Per note brace is to be unlocked. Patient feels more comfortabel with brace locked. Therapy advised patient that it is better to be uncloked for ROM and stiffness. She will consider keeping it unlcoked    Exercises Total Joint Exercises Ankle Circles/Pumps: 15 reps Quad Sets: 15 reps Gluteal Sets: 15 reps Goniometric ROM: reviewed self flexion stretch per visual inspection ROM about 30 degrees. Patient shown how to unlock brace for ROM.   Assessment/Plan    PT Assessment Patient needs continued PT services  PT Problem List Decreased strength;Decreased range of motion;Decreased activity tolerance;Decreased balance;Decreased mobility       PT Treatment Interventions DME instruction;Gait training;Stair training;Functional mobility training;Therapeutic activities;Therapeutic exercise;Balance training;Patient/family education    PT Goals (Current goals can be found in the Care Plan section)  Acute Rehab PT Goals Patient Stated Goal: to start the healing and strengthening process PT Goal Formulation: With patient Time For Goal Achievement: 11/17/20 Potential to Achieve Goals: Good    Frequency Min 5X/week   Barriers to discharge        Co-evaluation               AM-PAC PT "6 Clicks" Mobility  Outcome Measure Help needed turning from your back to your side while in a flat bed without using bedrails?: A Little Help needed moving from lying on your back to sitting on the side of a flat bed without using bedrails?: A Little Help needed moving to and from a bed  to a chair (including a wheelchair)?: A Little Help needed standing up from a chair using your arms (e.g., wheelchair or bedside chair)?: A Little Help needed to walk in hospital room?: A Little Help needed climbing 3-5 steps with a railing? : A Lot 6 Click Score: 17    End of Session Equipment Utilized During Treatment: Gait belt Activity Tolerance: Patient tolerated treatment well     PT Visit Diagnosis: Muscle weakness (generalized) (M62.81);Difficulty in walking, not elsewhere classified (R26.2)    Time: 2725-3664 PT Time Calculation (min) (ACUTE ONLY): 30 min   Charges:   PT Evaluation $PT Eval Moderate Complexity: 1 Mod            Dessie Coma PT DPT  11/17/2020, 2:09 PM

## 2020-11-17 NOTE — Anesthesia Postprocedure Evaluation (Signed)
Anesthesia Post Note  Patient: Natalie Berg  Procedure(s) Performed: OPEN REDUCTION INTERNAL FIXATION (ORIF) TIBIAL PLATEAU REMOVE EXTERNAL FIXATOR (Left Leg Lower)     Patient location during evaluation: PACU Anesthesia Type: General Level of consciousness: awake Pain management: pain level controlled Vital Signs Assessment: post-procedure vital signs reviewed and stable Respiratory status: spontaneous breathing, nonlabored ventilation, respiratory function stable and patient connected to nasal cannula oxygen Cardiovascular status: blood pressure returned to baseline and stable Postop Assessment: no apparent nausea or vomiting Anesthetic complications: no   No complications documented.  Last Vitals:  Vitals:   11/16/20 2157 11/17/20 0441  BP: 125/74 137/70  Pulse: 87 95  Resp: 18 15  Temp: 36.5 C 36.4 C  SpO2: 98% 100%    Last Pain:  Vitals:   11/17/20 0628  TempSrc:   PainSc: 5                  Aslynn Brunetti P Brenlyn Beshara

## 2020-11-17 NOTE — Progress Notes (Signed)
Initial Nutrition Assessment  DOCUMENTATION CODES:   Obesity unspecified  INTERVENTION:    Ensure Enlive po BID, each supplement provides 350 kcal and 20 grams of protein  MVI with minerals daily  NUTRITION DIAGNOSIS:   Increased nutrient needs related to post-op healing as evidenced by estimated needs.  GOAL:   Patient will meet greater than or equal to 90% of their needs  MONITOR:   PO intake,Supplement acceptance  REASON FOR ASSESSMENT:   Consult Assessment of nutrition requirement/status  ASSESSMENT:   50 yo female admitted 4/14 with L tibial plateau fracture s/p fall down stairs. S/P external fixator placement 4/16. Readmitted 4/29 for surgical intervention. PMH includes allergic asthma, anxiety, chronic back pain, depression, osteoarthritis, GERD, PTSD.   S/P ORIF of tibial plateau 4/29.   Currently on a regular diet. Meal intakes not recorded. Unable to speak with patient at this time.   Labs reviewed. Na 134  Medications reviewed and include vitamin C, Colace, cholecalciferol, ergocalciferol.  NUTRITION - FOCUSED PHYSICAL EXAM:  unable to complete  Diet Order:   Diet Order            Diet regular Room service appropriate? Yes; Fluid consistency: Thin  Diet effective now                 EDUCATION NEEDS:   No education needs have been identified at this time  Skin:  Skin Assessment: Reviewed RN Assessment  Last BM:  4/15 per RN documentation  Height:   Ht Readings from Last 1 Encounters:  11/16/20 5\' 7"  (1.702 m)    Weight:   Wt Readings from Last 1 Encounters:  11/16/20 97.5 kg    Ideal Body Weight:  61.4 kg  BMI:  Body mass index is 33.67 kg/m.  Estimated Nutritional Needs:   Kcal:  1900-2100  Protein:  100-120 gm  Fluid:  >/= 2 L    11/18/20, RD, LDN, CNSC Please refer to Amion for contact information.

## 2020-11-17 NOTE — Plan of Care (Signed)
  Problem: Education: Goal: Knowledge of General Education information will improve Description: Including pain rating scale, medication(s)/side effects and non-pharmacologic comfort measures Outcome: Progressing   Problem: Clinical Measurements: Goal: Will remain free from infection Outcome: Progressing   Problem: Nutrition: Goal: Adequate nutrition will be maintained Outcome: Progressing   Problem: Pain Managment: Goal: General experience of comfort will improve Outcome: Progressing   Problem: Safety: Goal: Ability to remain free from injury will improve Outcome: Progressing   Problem: Skin Integrity: Goal: Risk for impaired skin integrity will decrease Outcome: Progressing   

## 2020-11-17 NOTE — Evaluation (Signed)
Occupational Therapy Evaluation Patient Details Name: Natalie Berg MRN: [id number] DOB: 08-24-1970 Today's Date: 11/17/2020    History of Present Illness 50 y.o. female who fell at home (4/14)  and presented to the emergency room with a left medial plateau fracture. Patient s/p L LE spanning ex fix application on 8/30. She has now returned for an ORIF of the tibial paleau PMH: GERD, depression, anxiety, mitral valve prolapse, peripheral neuropathy.   Clinical Impression   Patient re-admit for the above scheduled procedure.  Patient has been living at home with PRN assist from family since placement of ex-fix to LLE and TDWB.  She has all needed DME, and has been subsisting quite well at home.  The patient's ADL routine at home was reviewed, hip kit was shown, and she is essentially at the same level for ADL and toileting, that she was at home.  She believes she's doing a bit better, because the hinged brace is less cumbersome that the ex-fix.  She has developed great compensatory strategies, and demonstrates good safety. PT will continue mobility in the acute setting, but no additional OT in acute is deemed necessary.       Follow Up Recommendations  No OT follow up;Supervision - Intermittent    Equipment Recommendations  None recommended by OT    Recommendations for Other Services       Precautions / Restrictions Precautions Precautions: Fall Required Braces or Orthoses: Other Brace Knee Immobilizer - Left: Other (comment);On at all times Other Brace: L hinged brace, unlocked, able to take off with PT, but otherwise on at all times. Restrictions Weight Bearing Restrictions: Yes LLE Weight Bearing: Non weight bearing Other Position/Activity Restrictions: Patient is doing closer to TDWB      Mobility Bed Mobility Overal bed mobility: Needs Assistance Bed Mobility: Sit to Supine     Supine to sit: Min assist Sit to supine: Modified independent (Device/Increase time)    General bed mobility comments: min a to help left leg out of bed. Once to the edge of the bed indepdnent    Transfers Overall transfer level: Needs assistance Equipment used: Rolling walker (2 wheeled) Transfers: Sit to/from [facility] Sit to Stand: Supervision Stand pivot transfers: Supervision       General transfer comment: min a for strength and inital balance    Balance Overall balance assessment: Needs assistance Sitting-balance support: No upper extremity supported;Feet supported Sitting balance-Leahy Scale: Good     Standing balance support: Bilateral upper extremity supported Standing balance-Leahy Scale: Poor Standing balance comment: reliant on UE support                           ADL either performed or assessed with clinical judgement   ADL       Grooming: Set up;Sitting;Standing       Lower Body Bathing: Supervison/ safety;Sit to/from stand   Upper Body Dressing : Set up;Sitting   Lower Body Dressing: Set up;Sit to/from stand Lower Body Dressing Details (indicate cue type and reason): educated on dressing in front of counter or solid dresser to allow her to maintain WBS and perfrom pant management having at least one hand on a solid surface.             Functional mobility during ADLs: Supervision/safety;Rolling walker       Vision Baseline Vision/History: Wears glasses Patient Visual Report: No change from baseline       Perception  Praxis      Pertinent Vitals/Pain Pain Assessment: 0-10 Pain Score: 8  Faces Pain Scale: Hurts little more Pain Location: L LE Pain Descriptors / Indicators: Sore;Tender Pain Intervention(s): Monitored during session     Hand Dominance Right   Extremity/Trunk Assessment Upper Extremity Assessment Upper Extremity Assessment: Overall WFL for tasks assessed   Lower Extremity Assessment Lower Extremity Assessment: Defer to PT evaluation LLE Deficits / Details: minimal  quad set noted   Cervical / Trunk Assessment Cervical / Trunk Assessment: Normal   Communication Communication Communication: No difficulties   Cognition Arousal/Alertness: Awake/alert Behavior During Therapy: WFL for tasks assessed/performed Overall Cognitive Status: Within Functional Limits for tasks assessed                                     General Comments  Per note brace is to be unlocked. Patient feels more comfortabel with brace locked. Therapy advised patient that it is better to be uncloked for ROM and stiffness. She will consider keeping it unlcoked    Exercises Exercises: Total Joint Total Joint Exercises Ankle Circles/Pumps: 15 reps Quad Sets: 15 reps Gluteal Sets: 15 reps Goniometric ROM: reviewed self flexion stretch per visual inspection ROM about 30 degrees. Patient shown how to unlock brace for ROM.   Shoulder Instructions      Home Living Family/patient expects to be discharged to:: Private residence Living Arrangements: Alone Available Help at Discharge: Family;Available PRN/intermittently Type of Home: House Home Access: Stairs to enter [employer] of Steps: 2 Entrance Stairs-Rails: Left Home Layout: One level     Bathroom Shower/Tub: Teacher, early years/pre: Handicapped height Bathroom Accessibility: Yes How Accessible: Accessible via walker Home Equipment: Grab bars - tub/shower;Wheelchair - manual;Grab bars - toilet;Walker - 2 wheels;Tub bench;Adaptive equipment Adaptive Equipment: Reacher;Other (Comment) (leg lifter issued) Additional Comments: nephew and brother can assist PRN      Prior Functioning/Environment Level of Independence: Independent        Comments: Per PT: drives, not working, travels between [place] and [employer] 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        OT Problem List: Impaired balance (sitting and/or standing);Pain      OT Treatment/Interventions:      OT Goals(Current goals can be  found in the care plan section) Acute Rehab OT Goals Patient Stated Goal: To be safe and get stronger OT Goal Formulation: With patient Time For Goal Achievement: 11/17/20 Potential to Achieve Goals: Good  OT Frequency:     Barriers to D/C:  none noted          Co-evaluation              AM-PAC OT "6 Clicks" Daily Activity     Outcome Measure Help from another person eating meals?: None Help from another person taking care of personal grooming?: None Help from another person toileting, which includes using toliet, bedpan, or urinal?: A Little Help from another person bathing (including washing, rinsing, drying)?: A Little Help from another person to put on and taking off regular upper body clothing?: None Help from another person to put on and taking off regular lower body clothing?: A Little 6 Click Score: 21   End of Session Equipment Utilized During Treatment: Gait belt  Activity Tolerance: Patient tolerated treatment well Patient left: in bed;with call bell/phone within reach  OT Visit Diagnosis: Muscle weakness (generalized) (M62.81);Pain Pain - Right/Left:  Left Pain - part of body: Leg                Time: 3543-0148 OT Time Calculation (min): 19 min Charges:  OT General Charges $OT Visit: 1 Visit OT Evaluation $OT Eval Moderate Complexity: 1 Mod  11/17/2020  Rich, OTR/L  Acute Rehabilitation Services  Office:  [place] 11/17/2020, 3:25 PM

## 2020-11-18 LAB — BASIC METABOLIC PANEL
Anion gap: 6 (ref 5–15)
BUN: 11 mg/dL (ref 6–20)
CO2: 27 mmol/L (ref 22–32)
Calcium: 8.7 mg/dL — ABNORMAL LOW (ref 8.9–10.3)
Chloride: 103 mmol/L (ref 98–111)
Creatinine, Ser: 0.93 mg/dL (ref 0.44–1.00)
GFR, Estimated: >60 mL/min (ref >60)
Glucose, Bld: 126 mg/dL — ABNORMAL HIGH (ref 70–99)
Potassium: 4 mmol/L (ref 3.5–5.1)
Sodium: 136 mmol/L (ref 135–145)

## 2020-11-18 MED ORDER — ACETAMINOPHEN 500 MG PO TABS: 1000.0000 mg | ORAL_TABLET | Freq: Three times a day (TID) | ORAL | 0 refills | Status: AC

## 2020-11-18 MED ORDER — ENOXAPARIN SODIUM 40 MG/0.4ML IJ SOSY: 40.0000 mg | PREFILLED_SYRINGE | INTRAMUSCULAR | 0 refills | Status: DC

## 2020-11-18 MED ORDER — METHOCARBAMOL 500 MG PO TABS: 500.0000 mg | ORAL_TABLET | Freq: Three times a day (TID) | ORAL | 0 refills | Status: DC | PRN

## 2020-11-18 NOTE — Progress Notes (Signed)
ORTHOPAEDIC PROGRESS NOTE  s/p Procedure(s): OPEN REDUCTION INTERNAL FIXATION (ORIF) TIBIAL PLATEAU REMOVE EXTERNAL FIXATOR on 4/29 with Dr. Carola Frost  SUBJECTIVE: Reports mild pain about operative site. Improved since yesterday. No chest pain. No SOB. No nausea/vomiting. No other complaints.    OBJECTIVE: PE: General: resting in hospital bed, NAD Cardiac: regular rate Pulmonary: no increased work of breathing GI: abdomen soft, non-tender LLE: dressings and incisions CDI. Bledsoe brace. intact EHL/TA/GSC. Endorses distal sensation. Warm well perfused foot.    Vitals:   11/18/20 0412 11/18/20 0759  BP: (!) 102/54 (!) 111/56  Pulse: 94 (!) 104  Resp: 14 18  Temp: 98.2 F (36.8 C) 98.6 F (37 C)  SpO2: 99% 97%     ASSESSMENT: Natalie Berg is a 50 y.o. female POD#2  PLAN: Weightbearing: NWB LLE Insicional and dressing care: Dressings left intact until follow-up Orthopedic device(s): None Showering: do not get cast wet VTE prophylaxis: Lovenox 40mg  qd until weight bearing Pain control: well controlled Follow - up plan: 2 weeks in office with Dr. . Dispo: PT/OT in hospital. Possibly home today or tomorrow. Has all DME needs at home.  Contact information:  After hours and holidays please check [email or web address] for group call information for [facility]  Carola Frost, PA-C 11/18/2020, 10:07 AM

## 2020-11-18 NOTE — Discharge Instructions (Signed)
Orthopaedic Trauma Service Discharge Instructions  General Discharge Instructions  Orthopaedic Injuries: Left tibial plateau fracture  WEIGHT BEARING STATUS: Non-weight bearing left leg  RANGE OF MOTION/ACTIVITY: Unrestricted range of motion of the left knee in the hinged knee brace  Wound Care: You may remove the operative dressing on post-op day #3. Keep incisions clean and dry.  You may reinforce/reapply the dressings as per discharge wound care instructions below.   DVT/PE prophylaxis: Lovenox  Diet: as you were eating previously.  Can use over the counter stool softeners and bowel preparations, such as Miralax, to help with bowel movements.  Narcotics can be constipating.  Be sure to drink plenty of fluids  PAIN MEDICATION USE AND EXPECTATIONS  You have likely been given narcotic medications to help control your pain.  After a traumatic event that results in an fracture (broken bone) with or without surgery, it is ok to use narcotic pain medications to help control one's pain.  We understand that everyone responds to pain differently and each individual patient will be evaluated on a regular basis for the continued need for narcotic medications. Ideally, narcotic medication use should last no more than 6-8 weeks (coinciding with fracture healing).   As a patient it is your responsibility as well to monitor narcotic medication use and report the amount and frequency you use these medications when you come to your office visit.   We would also advise that if you are using narcotic medications, you should take a dose prior to therapy to maximize you participation.  IF YOU ARE ON NARCOTIC MEDICATIONS IT IS NOT PERMISSIBLE TO OPERATE A MOTOR VEHICLE (MOTORCYCLE/CAR/TRUCK/MOPED) OR HEAVY MACHINERY DO NOT MIX NARCOTICS WITH OTHER CNS (CENTRAL NERVOUS SYSTEM) DEPRESSANTS SUCH AS ALCOHOL  POST-OP MEDICATIONS- Multimodal approach to pain control . In general your pain will be controlled with  a combination of substances.  Prescriptions unless otherwise discussed are electronically sent to your pharmacy.  This is a carefully made plan we use to minimize narcotic use.      - Acetaminophen - Non-narcotic pain medicine taken on a scheduled basis     - Take two 500 mg tablets every 8 hours  - Dilaudid - This is a strong narcotic, to be used only on an "as needed" basis for severe pain.    - This medication was prescribed to you before your surgery    - You may continue taking this medication as needed for severe pain  - Robaxin - take as needed for muscle spasms  -  Lovenox - This medicine is used to minimize the risk of blood clots after surgery.    STOP SMOKING OR USING NICOTINE PRODUCTS!!!!  As discussed nicotine severely impairs your body's ability to heal surgical and traumatic wounds but also impairs bone healing.  Wounds and bone heal by forming microscopic blood vessels (angiogenesis) and nicotine is a vasoconstrictor (essentially, shrinks blood vessels).  Therefore, if vasoconstriction occurs to these microscopic blood vessels they essentially disappear and are unable to deliver necessary nutrients to the healing tissue.  This is one modifiable factor that you can do to dramatically increase your chances of healing your injury.    (This means no smoking, no nicotine gum, patches, etc)  DO NOT USE NONSTEROIDAL ANTI-INFLAMMATORY DRUGS (NSAID'S)  Using products such as Advil (ibuprofen), Aleve (naproxen), Motrin (ibuprofen) for additional pain control during fracture healing can delay and/or prevent the healing response.  If you would like to take over the counter (OTC) medication,  Tylenol (acetaminophen) is ok.  However, some narcotic medications that are given for pain control contain acetaminophen as well. Therefore, you should not exceed more than 4000 mg of tylenol in a day if you do not have liver disease.  Also note that there are may OTC medicines, such as cold medicines and  allergy medicines that my contain tylenol as well.  If you have any questions about medications and/or interactions please ask your doctor/PA or your pharmacist.     ICE AND ELEVATE INJURED/OPERATIVE EXTREMITY  Using ice and elevating the injured extremity above your heart can help with swelling and pain control.  Icing in a pulsatile fashion, such as 20 minutes on and 20 minutes off, can be followed.    Do not place ice directly on skin. Make sure there is a barrier between to skin and the ice pack.    Using frozen items such as frozen peas works well as the conform nicely to the are that needs to be iced.  USE AN ACE WRAP OR TED HOSE FOR SWELLING CONTROL  In addition to icing and elevation, Ace wraps or TED hose are used to help limit and resolve swelling.  It is recommended to use Ace wraps or TED hose until you are informed to stop.    When using Ace Wraps start the wrapping distally (farthest away from the body) and wrap proximally (closer to the body)   Example: If you had surgery on your leg or thing and you do not have a splint on, start the ace wrap at the toes and work your way up to the thigh        If you had surgery on your upper extremity and do not have a splint on, start the ace wrap at your fingers and work your way up to the upper arm  IF YOU ARE IN A SPLINT OR CAST DO NOT REMOVE IT FOR ANY REASON   If your splint gets wet for any reason please contact the office immediately. You may shower in your splint or cast as long as you keep it dry.  This can be done by wrapping in a cast cover or garbage back (or similar)  Do Not stick any thing down your splint or cast such as pencils, money, or hangers to try and scratch yourself with.  If you feel itchy take benadryl as prescribed on the bottle for itching  IF YOU ARE IN A CAM BOOT (BLACK BOOT)  You may remove boot periodically. Perform daily dressing changes as noted below.  Wash the liner of the boot regularly and wear a sock when  wearing the boot. It is recommended that you sleep in the boot until told otherwise  Call office for the following:  Temperature greater than 101F  Persistent nausea and vomiting  Severe uncontrolled pain  Redness, tenderness, or signs of infection (pain, swelling, redness, odor or green/yellow discharge around the site)  Difficulty breathing, headache or visual disturbances  Hives  Persistent dizziness or light-headedness  Extreme fatigue  Any other questions or concerns you may have after discharge  In an emergency, call 911 or go to an Emergency Department at a nearby hospital  HELPFUL INFORMATION  ? If you had a block, it will wear off between 8-24 hrs postop typically.  This is period when your pain may go from nearly zero to the pain you would have had postop without the block.  This is an abrupt transition but nothing dangerous is  happening.  You may take an extra dose of narcotic when this happens.  ? You should wean off your narcotic medicines as soon as you are able.  Most patients will be off or using minimal narcotics before their first postop appointment.   ? We suggest you use the pain medication the first night prior to going to bed, in order to ease any pain when the anesthesia wears off. You should avoid taking pain medications on an empty stomach as it will make you nauseous.  ? Do not drink alcoholic beverages or take illicit drugs when taking pain medications.  ? In most states it is against the law to drive while you are in a splint or sling.  And certainly against the law to drive while taking narcotics.  ? You may return to work/school in the next couple of days when you feel up to it.   ? Pain medication may make you constipated.  Below are a few solutions to try in this order: - Decrease the amount of pain medication if you aren't having pain. - Drink lots of decaffeinated fluids. - Drink prune juice and/or each dried prunes  o If the first 3 don't  work start with additional solutions - Take Colace - an over-the-counter stool softener - Take Senokot - an over-the-counter laxative - Take Miralax - a stronger over-the-counter laxative  Discharge Wound Care Instructions  Do NOT apply any ointments, solutions or lotions to pin sites or surgical wounds.  These prevent needed drainage and even though solutions like hydrogen peroxide kill bacteria, they also damage cells lining the pin sites that help fight infection.  Applying lotions or ointments can keep the wounds moist and can cause them to breakdown and open up as well. This can increase the risk for infection. When in doubt call the office.  Surgical incisions should be dressed daily.  If any drainage is noted, use one layer of adaptic, then gauze, Kerlix, and an ace wrap.  Once the incision is completely dry and without drainage, it may be left open to air out.  Showering may begin 36-48 hours later.  Cleaning gently with soap and water.  Traumatic wounds should be dressed daily as well.    One layer of adaptic, gauze, Kerlix, then ace wrap.  The adaptic can be discontinued once the draining has ceased    If you have a wet to dry dressing: wet the gauze with saline the squeeze as much saline out so the gauze is moist (not soaking wet), place moistened gauze over wound, then place a dry gauze over the moist one, followed by Kerlix wrap, then ace wrap.   CALL THE OFFICE WITH ANY QUESTIONS OR CONCERNS: [phone]   VISIT OUR WEBSITE FOR ADDITIONAL INFORMATION: [email or web address]

## 2020-11-19 ENCOUNTER — Encounter (HOSPITAL_COMMUNITY): Payer: Self-pay | Admitting: Orthopedic Surgery

## 2020-11-19 LAB — BASIC METABOLIC PANEL
Anion gap: 7 (ref 5–15)
BUN: 10 mg/dL (ref 6–20)
CO2: 23 mmol/L (ref 22–32)
Calcium: 8.5 mg/dL — ABNORMAL LOW (ref 8.9–10.3)
Chloride: 106 mmol/L (ref 98–111)
Creatinine, Ser: 0.66 mg/dL (ref 0.44–1.00)
GFR, Estimated: >60 mL/min (ref >60)
Glucose, Bld: 98 mg/dL (ref 70–99)
Potassium: 4.9 mmol/L (ref 3.5–5.1)
Sodium: 136 mmol/L (ref 135–145)

## 2020-11-19 NOTE — Progress Notes (Signed)
Physical Therapy Treatment Patient Details Name: Natalie Berg MRN: [id number] DOB: 1970/12/21 Today's Date: 11/19/2020    History of Present Illness 50 y.o. female who fell at home (4/14)  and presented to the emergency room with a left medial plateau fracture. Patient s/p L LE spanning ex fix application on 4/16. She has now returned for an ORIF of the tibial paleau PMH: GERD, depression, anxiety, mitral valve prolapse, peripheral neuropathy.    PT Comments    Pt was supine in bed upon arrival. She is progressing with gait training and denied stair training as she states she "feels comfortable with stairs". She continues to need reinforcement to maintain NWB status. Pt continues to TDWB on LLE. HEP was given.  Pt expected to d/c today.   Follow Up Recommendations  Home health PT;Supervision/Assistance - 24 hour     Equipment Recommendations  None recommended by PT    Recommendations for Other Services       Precautions / Restrictions Precautions Precautions: Fall Required Braces or Orthoses: Other Brace Knee Immobilizer - Left: Other (comment);On at all times Other Brace: L hinged brace unlocked and to be kept on at all times. May be removed for PT. Restrictions Weight Bearing Restrictions: Yes LLE Weight Bearing: Non weight bearing Other Position/Activity Restrictions: Pt continues to do more TDWB than NWB    Mobility  Bed Mobility Overal bed mobility: Needs Assistance Bed Mobility: Supine to Sit     Supine to sit: Supervision     General bed mobility comments: supervision for safety.    Transfers Overall transfer level: Needs assistance Equipment used: Rolling walker (2 wheeled) Transfers: Sit to/from Stand Sit to Stand: Supervision         General transfer comment: Supervision for safety; cues for hand placement.  Ambulation/Gait Ambulation/Gait assistance: Min guard;+2 safety/equipment (chair follow) Gait Distance (Feet): 54 Feet (After sitting rest  break completed 43ft) Assistive device: Rolling walker (2 wheeled) Gait Pattern/deviations: Step-to pattern (hop to pattern) Gait velocity: decreased   General Gait Details: Required on sitting break. +2 for chair follow.  Pt required mod cues to maintain NWB status. SPTA had pt perform standing hamstring curls to initiate L knee flexion and instruct patient on motion that needed to be maintained in order to clear LLE from floor to maintain NWB. Afterwards pt maintained NWB 75% of the time and TDWB other 25%; Pt gait trained with hop to pattern.   Stairs             Wheelchair Mobility    Modified Rankin (Stroke Patients Only)       Balance Overall balance assessment: Needs assistance Sitting-balance support: No upper extremity supported;Feet supported Sitting balance-Leahy Scale: Good     Standing balance support: Bilateral upper extremity supported Standing balance-Leahy Scale: Poor                              Cognition Arousal/Alertness: Awake/alert Behavior During Therapy: WFL for tasks assessed/performed Overall Cognitive Status: Within Functional Limits for tasks assessed                                        Exercises General Exercises - Lower Extremity Ankle Circles/Pumps: AROM;Both;20 reps;Supine Quad Sets: AROM;Left;10 reps;Supine Short Arc Quad: AROM;Left;10 reps;Supine Hip ABduction/ADduction: AROM;Left;10 reps;Supine Other Exercises Other Exercises: Hamstring curls x 10; Standing in walker  Other Exercises: Ankle Dorsiflexors stretch with strap x 3    General Comments General comments (skin integrity, edema, etc.): Brace may be removed by PT for ROM, but otherwise on at all times. It is to be unlocked, however pt prefers it in the locked position. SPTA educated pt on importance of it being unlocked to maintain ROM and decrease stiffness.      Pertinent Vitals/Pain Pain Assessment: 0-10 Pain Score: 5  Pain Location: L  LE Pain Descriptors / Indicators: Tender;Sore Pain Intervention(s): Monitored during session    Home Living                      Prior Function            PT Goals (current goals can now be found in the care plan section) Acute Rehab PT Goals Potential to Achieve Goals: Good Progress towards PT goals: Progressing toward goals    Frequency    Min 5X/week      PT Plan Current plan remains appropriate    Co-evaluation              AM-PAC PT "6 Clicks" Mobility   Outcome Measure  Help needed turning from your back to your side while in a flat bed without using bedrails?: A Little Help needed moving from lying on your back to sitting on the side of a flat bed without using bedrails?: A Little Help needed moving to and from a bed to a chair (including a wheelchair)?: A Little Help needed standing up from a chair using your arms (e.g., wheelchair or bedside chair)?: A Little Help needed to walk in hospital room?: A Little Help needed climbing 3-5 steps with a railing? : A Lot 6 Click Score: 17    End of Session Equipment Utilized During Treatment: Gait belt Activity Tolerance: Patient tolerated treatment well Patient left: in chair;with call bell/phone within reach Nurse Communication: Mobility status PT Visit Diagnosis: Muscle weakness (generalized) (M62.81);Difficulty in walking, not elsewhere classified (R26.2)     Time: 7408-1448 PT Time Calculation (min) (ACUTE ONLY): 29 min  Charges:  $Gait Training: 8-22 mins $Therapeutic Exercise: 8-22 mins                      Kathrine Haddock, SPTA    Kathrine Haddock 11/19/2020, 12:52 PM
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
## 2020-11-29 ENCOUNTER — Other Ambulatory Visit: Payer: Self-pay | Admitting: Psychiatry

## 2020-11-29 DIAGNOSIS — F4001 Agoraphobia with panic disorder: Secondary | ICD-10-CM

## 2020-11-30 NOTE — Telephone Encounter (Signed)
Last apt 01/2020  No apts scheduled

## 2020-12-04 NOTE — Op Note (Signed)
11/16/2020  PATIENT:  Natalie Berg  50 y.o. female  [id number]  PRE-OPERATIVE DIAGNOSIS:   LEFT BICONDYLAR TIBIAL PLATEAU FRACTURE 2.   RETAINED EXTERNAL FIXATOR 3.   PROBABLE LATERAL MENISCUS TEAR 4.   ULCERATED PIN SITES, FEMUR AND TIBIA 5.   LEFT TIBIAL SPINE/ EMINENCE FRACTURE  POST-OPERATIVE DIAGNOSIS:   LEFT BICONDYLAR TIBIAL PLATEAU FRACTURE 2.   RETAINED EXTERNAL FIXATOR 3.   PROBABLE LATERAL MENISCUS TEAR 4.   ULCERATED PIN SITES, FEMUR AND TIBIA 5.   LEFT TIBIAL SPINE/ EMINENCE FRACTURE  PROCEDURE:   1. ORIF OF left BICONDYLAR TIBIAL PLATEAU FRACTURE  2. ARTHROTOMY WITH PARTIAL MENISCECTOMY LEFT LATERAL MENISCUS 3. REPAIR OF TIBIAL SPINE/ EMINENCE LEFT TIBIA 5. ANTERIOR COMPARTMENT FASCIOTOMY 6. REMOVAL OF EXTERNAL FIXATOR UNDER ANESTHESIA 7. CURETTAGE OF ULCERATED PIN SITES INCLUDING BONE OF TIBIA AND FEMUR  SURGEON:  Surgeon(s) and Role:    Myrene Galas, MD - Primary  PHYSICIAN ASSISTANT: Montez Morita, PA-C  ANESTHESIA:   general  I/O:  No intake/output data recorded.  SPECIMEN:  None  TOURNIQUET:   Total Tourniquet Time Documented: Thigh (Left) - 77 minutes Total: Thigh (Left) - 77 minutes   DICTATION: .Note written in EPIC  DISPOSITION: PACU  CONDITION: STABLE     BRIEF SUMMARY AND INDICATION FOR PROCEDURE:  Patient is a 50 y.o.-year- old with a tibial plateau fracture, treated provisionally with external fixation, aggressive ice, elevation, and active motion of the foot and toes to facilitate resolution of soft tissue swelling.  We did discuss with the patient the risks and benefits of surgical treatment including the potential for arthritis, nerve injury, vessel injury, loss of motion, DVT, PE, heart attack, stroke, symptomatic hardware, need for further surgery, and multiple others.  The patient acknowledged these risks and wished to proceed.   BRIEF SUMMARY OF PROCEDURE:  After administration of preoperative antibiotics, the patient was  taken to the operating room.  General anesthesia was induced. The external fixator was scrubbed along with skin and pin sites with chlorhexidine sponge, followed by shaving of the surgical field, and betadine scrub and paint. The lower extremity was prepped and draped in usual sterile fashion using a chlorhexidine wash and betadine scrub and paint. A timeout was performed. We did retain the fixator to protect the neurovascular structures during prepping and also the reduction of the medial condyle, which had been one of the chief aims of external fixation. Once time-out was held, the leg was isolated from the clamps with towels and the fixator removed.  The ulcerated pin sites, which each measured approximately 10 mm in diameter were then debrided with serial curettes at the skin, subcutaneous tissue, muscle fascia, and cortical bone levels, excising all nonviable, contaminated, and devitalized tissue along the tracts back to healthy pink tissue.  Again, the depth of debridement was from skin to bone.  These were irrigated thoroughly. Additional Betadine paint was performed, Ioban used to isolate them from the surgical field, and new gloves obtained by operative staff.  I then brought in the radiolucent triangle.  A curvilinear incision was made extending laterally over Gerdy's tubercle. Dissection was carried down where the soft tissues were left intact to the lateral plateau and rim.  I did incise the retinaculum proximal to the tibial plateau, and then going along inside the retinaculum performed a submeniscal arthrotomy, releasing the coronary ligament along its insertion onto the tibia. Zero prolene suture was used to reflect this and inspect the meniscus and joint surface. The joint  was irrigated thoroughly and this revealed the lateral meniscus tear.  I was able to use a series of biters as well as a 15 blade to sharply debride the torn area back to a stable and healthy appearing edge.  The joint  surface was markedly depressed.  We then released some of the anterior extensors to enable the plate to fit along the proximal shaft. A trapdoor was made in the lateral tibial metaphysis. Because of continued oozing, particularly from the metaphysis, the decision was made to go ahead and elevate the leg, then exsanguinate with esmarch bandage and elevation of the tourniquet to 325 mmHg.  Through the trapdoor, I introduced a series of tamps and with my assistant pulling traction, I was able to elevate the articular surface in sequential fashion while watching it through the arthrotomy.  I was able to mobilize the eminence segment into reduction through the arthrotomy and secure it between the lateral and medial portions of the plateau. Once I had restored appropriate height to the lateral plateau, the bone defect was grafted with cancellous chips.  I then placed the plate laterally and used the [employer] clamp with the foot because of compromised bone strength to apply a compressive force across the joint line between the medial and lateral condyles of the tibia. This reduced the widened plateau back to the appropriate size.  At this point, we placed standard fixation in the proximal row of the plate followed by locked fixation.  The defect in the metaphysis was filled with cancellous bone and tamped into place. This was followed by additional fixation within the shaft. My assistant was careful to control alignment throughout by using traction and bending forces. He also assited with retraction. All wounds were irrigated thoroughly. Final AP and LAT fluoro images showed restoration of alignment, reduction, and varus/ valgus stability on stress view.   Prior to closure, I turned my attention to the distal edge of the wound here underneath the skin.  I used the long scissors to spread both superficial and deep to the anterior compartment.  The fascia was then released for 8 to 10 cm to reduce the likelihood  of the postoperative compartment syndrome.  Once more, wound was irrigated and then a standard layered closure performed, 0 Vicryl, 2-0 Vicryl, and 2-0 nylon for the skin.  Sterile gently compressive dressing was applied and in knee immobilizer.  The patient was taken to the PACU in stable condition.   PROGNOSIS: The patient will be transitioned into a hinged knee brace with unrestricted range of motion and this will begin immediately.  Orders entered for nonweightbearing on the operative extremity, pharmacologic DVT prophylaxis, and mobilization with PT and OT. After discharge, we will plan to see the patient back in about 2 weeks for removal of sutures and we will continue to follow throughout the hospital stay.         Doralee Albino. Carola Frost, M.D.

## 2021-02-26 ENCOUNTER — Other Ambulatory Visit: Payer: Self-pay | Admitting: Psychiatry

## 2021-02-26 DIAGNOSIS — F4001 Agoraphobia with panic disorder: Secondary | ICD-10-CM

## 2021-02-27 NOTE — Telephone Encounter (Signed)
Please schedule appt

## 2021-03-05 NOTE — Telephone Encounter (Signed)
Talked to pt. Need to RS

## 2021-03-05 NOTE — Telephone Encounter (Signed)
Last filled 7/12 appt on 10/27

## 2021-03-05 NOTE — Telephone Encounter (Signed)
Appt made for next avail 05/13/21. Please send in refill.

## 2021-04-26 ENCOUNTER — Telehealth: Payer: Self-pay | Admitting: Internal Medicine

## 2021-04-26 ENCOUNTER — Other Ambulatory Visit: Payer: Self-pay | Admitting: Internal Medicine

## 2021-04-26 MED ORDER — OMEPRAZOLE 20 MG PO CPDR: 20.0000 mg | DELAYED_RELEASE_CAPSULE | Freq: Three times a day (TID) | ORAL | 0 refills | Status: DC

## 2021-04-26 NOTE — Telephone Encounter (Signed)
LMTCB  Last OV 12/2019   CY please advise if okay to send one time refill of omeprazole. Patient has an appt 05/28/21. Patient requesting 90 day supply. Can send 30 day supply until sh e makes appt.

## 2021-04-26 NOTE — Telephone Encounter (Signed)
I sent the 1 month refill as requested, to [facility]

## 2021-04-26 NOTE — Telephone Encounter (Signed)
LM informing patient refill has been sent.   Nothing further needed at this time.  

## 2021-04-29 MED ORDER — OMEPRAZOLE 20 MG PO CPDR
20.0000 mg | DELAYED_RELEASE_CAPSULE | Freq: Three times a day (TID) | ORAL | 0 refills | Status: DC
Start: 2021-04-29 — End: 2021-05-28

## 2021-04-29 NOTE — Telephone Encounter (Signed)
Please change her prescription to 90  days, ref x 3
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
## 2021-05-16 ENCOUNTER — Ambulatory Visit (INDEPENDENT_AMBULATORY_CARE_PROVIDER_SITE_OTHER): Payer: Medicare Other | Admitting: Psychiatry

## 2021-05-16 ENCOUNTER — Other Ambulatory Visit: Payer: Self-pay

## 2021-05-16 ENCOUNTER — Encounter: Payer: Self-pay | Admitting: Psychiatry

## 2021-05-16 DIAGNOSIS — F411 Generalized anxiety disorder: Secondary | ICD-10-CM

## 2021-05-16 DIAGNOSIS — F3340 Major depressive disorder, recurrent, in remission, unspecified: Secondary | ICD-10-CM | POA: Diagnosis not present

## 2021-05-16 DIAGNOSIS — F4001 Agoraphobia with panic disorder: Secondary | ICD-10-CM | POA: Diagnosis not present

## 2021-05-16 MED ORDER — DULOXETINE HCL 60 MG PO CPEP: 60.0000 mg | ORAL_CAPSULE | Freq: Two times a day (BID) | ORAL | 3 refills | Status: DC

## 2021-05-16 MED ORDER — LORAZEPAM 1 MG PO TABS: ORAL_TABLET | ORAL | 5 refills | Status: DC
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
## 2021-05-23 ENCOUNTER — Other Ambulatory Visit: Payer: Self-pay | Admitting: Internal Medicine

## 2021-05-24 ENCOUNTER — Other Ambulatory Visit: Payer: Self-pay

## 2021-05-24 ENCOUNTER — Ambulatory Visit: Payer: Self-pay

## 2021-05-24 ENCOUNTER — Encounter: Payer: Self-pay | Admitting: Specialist

## 2021-05-24 ENCOUNTER — Ambulatory Visit: Payer: Medicare Other | Admitting: Specialist

## 2021-05-24 VITALS — BP 137/83 | HR 69 | Ht 67.0 in | Wt 215.0 lb

## 2021-05-24 DIAGNOSIS — M4322 Fusion of spine, cervical region: Secondary | ICD-10-CM

## 2021-05-24 DIAGNOSIS — M4326 Fusion of spine, lumbar region: Secondary | ICD-10-CM

## 2021-05-24 DIAGNOSIS — M48062 Spinal stenosis, lumbar region with neurogenic claudication: Secondary | ICD-10-CM

## 2021-05-24 DIAGNOSIS — M5136 Other intervertebral disc degeneration, lumbar region: Secondary | ICD-10-CM

## 2021-05-24 MED ORDER — MELOXICAM 15 MG PO TABS: 15.0000 mg | ORAL_TABLET | Freq: Every day | ORAL | 3 refills | Status: DC
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
## 2021-05-24 NOTE — Patient Instructions (Signed)
Avoid bending, stooping and avoid lifting weights greater than 10 lbs. Avoid prolong standing and walking. Avoid frequent bending and stooping  No lifting greater than 10 lbs. May use ice or moist heat for pain. Weight loss is of benefit. Handicap license is approved. Dr. Lance Sell secretary/Assistant will call to arrange for epidural steroid injection

## 2021-05-27 ENCOUNTER — Other Ambulatory Visit: Payer: Self-pay | Admitting: Specialist

## 2021-05-27 DIAGNOSIS — M5136 Other intervertebral disc degeneration, lumbar region: Secondary | ICD-10-CM

## 2021-05-27 DIAGNOSIS — M48062 Spinal stenosis, lumbar region with neurogenic claudication: Secondary | ICD-10-CM

## 2021-05-27 DIAGNOSIS — M4326 Fusion of spine, lumbar region: Secondary | ICD-10-CM
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
## 2021-05-28 ENCOUNTER — Ambulatory Visit: Payer: Medicare Other | Admitting: Internal Medicine

## 2021-05-28 ENCOUNTER — Encounter: Payer: Self-pay | Admitting: Internal Medicine

## 2021-05-28 ENCOUNTER — Other Ambulatory Visit: Payer: Self-pay

## 2021-05-28 VITALS — BP 140/88 | HR 109 | Temp 99.4°F | Ht 67.0 in | Wt 213.6 lb

## 2021-05-28 DIAGNOSIS — Z23 Encounter for immunization: Secondary | ICD-10-CM | POA: Diagnosis not present

## 2021-05-28 DIAGNOSIS — J302 Other seasonal allergic rhinitis: Secondary | ICD-10-CM

## 2021-05-28 DIAGNOSIS — J3089 Other allergic rhinitis: Secondary | ICD-10-CM

## 2021-05-28 DIAGNOSIS — J4530 Mild persistent asthma, uncomplicated: Secondary | ICD-10-CM | POA: Diagnosis not present

## 2021-05-28 MED ORDER — AZITHROMYCIN 250 MG PO TABS: ORAL_TABLET | ORAL | 1 refills | Status: DC

## 2021-05-28 MED ORDER — OMEPRAZOLE 20 MG PO CPDR
20.0000 mg | DELAYED_RELEASE_CAPSULE | Freq: Three times a day (TID) | ORAL | 0 refills | Status: DC
Start: 2021-05-28 — End: 2021-08-01

## 2021-05-28 NOTE — Assessment & Plan Note (Signed)
Uncomplicated Plan- Prevnar-20 pneumonia vaccine and Flu vaccine given with discusxion

## 2021-05-28 NOTE — Patient Instructions (Signed)
Order- flu vax- standard  Order- Prevnar 20  pneumonia vaccine      Ok to continue present meds  Please call if we can help

## 2021-05-28 NOTE — Assessment & Plan Note (Signed)
Mild nasal congestion can be managed symptomatically.  Plan- at her request she is given Zpak to hold

## 2021-05-30 ENCOUNTER — Ambulatory Visit: Payer: Medicare Other | Admitting: Specialist

## 2021-06-03 ENCOUNTER — Other Ambulatory Visit: Payer: Self-pay

## 2021-06-03 ENCOUNTER — Ambulatory Visit
Admission: RE | Admit: 2021-06-03 | Discharge: 2021-06-03 | Disposition: A | Discharge status: Home / Self Care | Payer: Medicare Other | Source: Ambulatory Visit | Attending: Specialist | Admitting: Specialist

## 2021-06-03 DIAGNOSIS — M48062 Spinal stenosis, lumbar region with neurogenic claudication: Secondary | ICD-10-CM

## 2021-06-03 DIAGNOSIS — M4326 Fusion of spine, lumbar region: Secondary | ICD-10-CM

## 2021-06-03 DIAGNOSIS — M5136 Other intervertebral disc degeneration, lumbar region: Secondary | ICD-10-CM

## 2021-06-03 MED ORDER — METHYLPREDNISOLONE ACETATE 40 MG/ML INJ SUSP (RADIOLOG
80.0000 mg | Freq: Once | INTRAMUSCULAR | Status: AC
  Administered 2021-06-03: 80 mg via EPIDURAL

## 2021-06-03 MED ORDER — IOPAMIDOL (ISOVUE-M 200) INJECTION 41%
1.0000 mL | Freq: Once | INTRAMUSCULAR | Status: AC
  Administered 2021-06-03: 1 mL via EPIDURAL

## 2021-06-03 NOTE — Discharge Instructions (Signed)

## 2021-06-19 ENCOUNTER — Other Ambulatory Visit: Payer: Self-pay | Admitting: Internal Medicine

## 2021-08-01 ENCOUNTER — Other Ambulatory Visit: Payer: Self-pay | Admitting: Internal Medicine

## 2021-08-14 ENCOUNTER — Telehealth: Payer: Self-pay | Admitting: Psychiatry

## 2021-08-14 DIAGNOSIS — F3340 Major depressive disorder, recurrent, in remission, unspecified: Secondary | ICD-10-CM

## 2021-08-14 DIAGNOSIS — F411 Generalized anxiety disorder: Secondary | ICD-10-CM

## 2021-08-14 MED ORDER — DULOXETINE HCL 60 MG PO CPEP: 60.0000 mg | ORAL_CAPSULE | Freq: Two times a day (BID) | ORAL | 3 refills | Status: DC

## 2021-08-14 NOTE — Telephone Encounter (Signed)
[facility] needs a new RX for Natalie Berg's Duloxetine 60 mg with refills. Their phone number is [phone] and fax # is [phone]. This is her new mail order pharmacy. Lauren's phone number is [phone].

## 2021-08-14 NOTE — Telephone Encounter (Signed)
Duplicated encounter-error

## 2021-08-14 NOTE — Telephone Encounter (Signed)
Rx sent 

## 2021-09-06 ENCOUNTER — Other Ambulatory Visit: Payer: Self-pay | Admitting: Internal Medicine

## 2021-11-03 ENCOUNTER — Other Ambulatory Visit: Payer: Self-pay | Admitting: Psychiatry

## 2021-11-03 DIAGNOSIS — F3340 Major depressive disorder, recurrent, in remission, unspecified: Secondary | ICD-10-CM

## 2021-11-03 DIAGNOSIS — F411 Generalized anxiety disorder: Secondary | ICD-10-CM

## 2021-11-28 ENCOUNTER — Other Ambulatory Visit: Payer: Self-pay | Admitting: Psychiatry

## 2021-11-28 DIAGNOSIS — F4001 Agoraphobia with panic disorder: Secondary | ICD-10-CM

## 2021-12-09 ENCOUNTER — Encounter: Payer: Self-pay | Admitting: Specialist

## 2021-12-09 ENCOUNTER — Ambulatory Visit: Payer: Medicare Other | Admitting: Specialist

## 2021-12-09 VITALS — BP 141/81 | HR 111 | Ht 67.0 in | Wt 214.0 lb

## 2021-12-09 DIAGNOSIS — M48062 Spinal stenosis, lumbar region with neurogenic claudication: Secondary | ICD-10-CM

## 2021-12-09 DIAGNOSIS — M4322 Fusion of spine, cervical region: Secondary | ICD-10-CM | POA: Diagnosis not present

## 2021-12-09 DIAGNOSIS — M5136 Other intervertebral disc degeneration, lumbar region: Secondary | ICD-10-CM | POA: Diagnosis not present

## 2021-12-09 DIAGNOSIS — M4326 Fusion of spine, lumbar region: Secondary | ICD-10-CM | POA: Diagnosis not present

## 2021-12-09 MED ORDER — MELOXICAM 15 MG PO TABS: 15.0000 mg | ORAL_TABLET | Freq: Every day | ORAL | 3 refills | Status: DC

## 2021-12-09 MED ORDER — HYDROCODONE-ACETAMINOPHEN 10-325 MG PO TABS: 0.5000 | ORAL_TABLET | Freq: Four times a day (QID) | ORAL | 0 refills | Status: DC | PRN

## 2021-12-09 NOTE — Patient Instructions (Signed)
Avoid bending, stooping and avoid lifting weights greater than 10 lbs. Avoid prolong standing and walking. Avoid frequent bending and stooping  No lifting greater than 10 lbs. May use ice or moist heat for pain. Weight loss is of benefit. Handicap license is approved. Dr. Meyer Russel secretary/Assistant at [facility] will call to arrange for epidural steroid injection
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
## 2021-12-12 ENCOUNTER — Other Ambulatory Visit: Payer: Self-pay | Admitting: Specialist

## 2021-12-12 DIAGNOSIS — M5136 Other intervertebral disc degeneration, lumbar region: Secondary | ICD-10-CM

## 2021-12-24 ENCOUNTER — Ambulatory Visit
Admission: RE | Admit: 2021-12-24 | Discharge: 2021-12-24 | Disposition: A | Discharge status: Home / Self Care | Payer: No Typology Code available for payment source | Source: Ambulatory Visit | Attending: Specialist | Admitting: Specialist

## 2021-12-24 DIAGNOSIS — M5136 Other intervertebral disc degeneration, lumbar region: Secondary | ICD-10-CM

## 2021-12-24 MED ORDER — METHYLPREDNISOLONE ACETATE 40 MG/ML INJ SUSP (RADIOLOG
80.0000 mg | Freq: Once | INTRAMUSCULAR | Status: AC
  Administered 2021-12-24: 80 mg via EPIDURAL

## 2021-12-24 MED ORDER — IOPAMIDOL (ISOVUE-M 200) INJECTION 41%
1.0000 mL | Freq: Once | INTRAMUSCULAR | Status: AC
  Administered 2021-12-24: 1 mL via EPIDURAL

## 2021-12-24 NOTE — Discharge Instructions (Signed)

## 2022-02-08 ENCOUNTER — Other Ambulatory Visit: Payer: Self-pay | Admitting: Internal Medicine

## 2022-02-10 ENCOUNTER — Encounter: Payer: Self-pay | Admitting: Internal Medicine

## 2022-02-10 MED ORDER — MECLIZINE HCL 25 MG PO TABS: 50.0000 mg | ORAL_TABLET | Freq: Two times a day (BID) | ORAL | 2 refills | Status: DC | PRN

## 2022-02-10 NOTE — Telephone Encounter (Signed)
Ok to refill meclizine prn  thanks

## 2022-02-18 ENCOUNTER — Telehealth: Payer: Self-pay | Admitting: Specialist

## 2022-02-18 NOTE — Telephone Encounter (Signed)
Called Natalie Berg 1X and left vm. Natalie Berg sent mychart message she would like an appt with Dr. Otelia Sergeant before he retires.

## 2022-02-21 ENCOUNTER — Ambulatory Visit: Payer: Medicare Other | Admitting: Specialist

## 2022-02-21 ENCOUNTER — Encounter: Payer: Self-pay | Admitting: Specialist

## 2022-02-21 VITALS — BP 141/77 | HR 123 | Ht 67.0 in | Wt 214.0 lb

## 2022-02-21 DIAGNOSIS — M7542 Impingement syndrome of left shoulder: Secondary | ICD-10-CM | POA: Diagnosis not present

## 2022-02-21 MED ORDER — METHYLPREDNISOLONE ACETATE 40 MG/ML IJ SUSP
40.0000 mg | INTRAMUSCULAR | Status: AC | PRN
  Administered 2022-02-21: 40 mg via INTRA_ARTICULAR

## 2022-02-21 MED ORDER — BUPIVACAINE HCL 0.5 % IJ SOLN
3.0000 mL | INTRAMUSCULAR | Status: AC | PRN
  Administered 2022-02-21: 3 mL via INTRA_ARTICULAR

## 2022-02-21 NOTE — Patient Instructions (Signed)
Plan: Avoid overhead lifting and overhead use of the arms. Pillows to keep from sleeping directly on the shoulders Limited lifting to less than 10 lbs. Ice or heat for relief. NSAIDs are helpful, such as alleve or motrin, be careful not to use in excess as they place burdens on the kidney. Stretching exercise help and strengthening is helpful to build endurance.  
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
## 2022-02-24 ENCOUNTER — Encounter: Payer: Self-pay | Admitting: Specialist

## 2022-02-24 ENCOUNTER — Other Ambulatory Visit: Payer: Self-pay | Admitting: Radiology

## 2022-02-27 ENCOUNTER — Other Ambulatory Visit: Payer: Self-pay | Admitting: Specialist

## 2022-02-27 MED ORDER — CYCLOBENZAPRINE HCL 5 MG PO TABS: 5.0000 mg | ORAL_TABLET | Freq: Three times a day (TID) | ORAL | 0 refills | Status: DC | PRN

## 2022-03-27 ENCOUNTER — Other Ambulatory Visit: Payer: Self-pay | Admitting: Specialist

## 2022-04-03 ENCOUNTER — Other Ambulatory Visit: Payer: Self-pay | Admitting: Psychiatry

## 2022-04-04 ENCOUNTER — Other Ambulatory Visit: Payer: Self-pay | Admitting: Psychiatry

## 2022-04-04 MED ORDER — DOXAZOSIN MESYLATE 4 MG PO TABS
4.0000 mg | ORAL_TABLET | Freq: Every evening | ORAL | 0 refills | Status: DC
Start: 2022-04-04 — End: 2022-07-04

## 2022-04-10 ENCOUNTER — Ambulatory Visit: Payer: Medicare Other | Admitting: Specialist

## 2022-04-10 ENCOUNTER — Encounter: Payer: Self-pay | Admitting: Specialist

## 2022-04-10 VITALS — BP 110/76 | HR 112 | Ht 67.0 in | Wt 214.0 lb

## 2022-04-10 DIAGNOSIS — M4326 Fusion of spine, lumbar region: Secondary | ICD-10-CM | POA: Diagnosis not present

## 2022-04-10 DIAGNOSIS — M7542 Impingement syndrome of left shoulder: Secondary | ICD-10-CM

## 2022-04-10 DIAGNOSIS — M4322 Fusion of spine, cervical region: Secondary | ICD-10-CM

## 2022-04-10 DIAGNOSIS — M47812 Spondylosis without myelopathy or radiculopathy, cervical region: Secondary | ICD-10-CM

## 2022-04-10 DIAGNOSIS — R292 Abnormal reflex: Secondary | ICD-10-CM

## 2022-04-10 DIAGNOSIS — M48062 Spinal stenosis, lumbar region with neurogenic claudication: Secondary | ICD-10-CM

## 2022-04-10 DIAGNOSIS — M5136 Other intervertebral disc degeneration, lumbar region: Secondary | ICD-10-CM | POA: Diagnosis not present

## 2022-04-10 NOTE — Patient Instructions (Signed)
Avoid overhead lifting and overhead use of the arms. Do not lift greater than 5 lbs. Adjust head rest in vehicle to prevent hyperextension if rear ended. Take extra precautions to avoid falling, including use of a cane if you feel weak. Avoid bending, stooping and avoid lifting weights greater than 10 lbs. Avoid prolong standing and walking. Avoid frequent bending and stooping  No lifting greater than 10 lbs. May use ice or moist heat for pain. Weight loss is of benefit. Handicap license is approved. Dr. Junius Finner secretary/Assistant will call to arrange for epidural steroid injection both cervical and lumbar. I will see you back after the ESI's. In the long term you have indicated that you would prefer to follow up with Dr. Melina Schools with [facility].
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
## 2022-04-10 NOTE — Addendum Note (Signed)
Addended by: Basil Dess on: 04/10/2022 11:13 AM   Modules accepted: Orders

## 2022-04-15 ENCOUNTER — Other Ambulatory Visit: Payer: Self-pay | Admitting: Specialist

## 2022-04-15 DIAGNOSIS — M5136 Other intervertebral disc degeneration, lumbar region: Secondary | ICD-10-CM

## 2022-04-15 DIAGNOSIS — M542 Cervicalgia: Secondary | ICD-10-CM

## 2022-05-14 ENCOUNTER — Encounter: Payer: Self-pay | Admitting: Specialist

## 2022-05-14 DIAGNOSIS — M7542 Impingement syndrome of left shoulder: Secondary | ICD-10-CM

## 2022-05-16 ENCOUNTER — Ambulatory Visit (INDEPENDENT_AMBULATORY_CARE_PROVIDER_SITE_OTHER): Payer: Medicare Other | Admitting: Specialist

## 2022-05-16 ENCOUNTER — Encounter: Payer: Self-pay | Admitting: Specialist

## 2022-05-16 VITALS — BP 101/70 | HR 120 | Ht 67.0 in | Wt 214.0 lb

## 2022-05-16 DIAGNOSIS — M48062 Spinal stenosis, lumbar region with neurogenic claudication: Secondary | ICD-10-CM

## 2022-05-16 DIAGNOSIS — M47812 Spondylosis without myelopathy or radiculopathy, cervical region: Secondary | ICD-10-CM

## 2022-05-16 DIAGNOSIS — M7542 Impingement syndrome of left shoulder: Secondary | ICD-10-CM

## 2022-05-16 DIAGNOSIS — M4326 Fusion of spine, lumbar region: Secondary | ICD-10-CM

## 2022-05-16 DIAGNOSIS — M5136 Other intervertebral disc degeneration, lumbar region: Secondary | ICD-10-CM

## 2022-05-16 DIAGNOSIS — M4322 Fusion of spine, cervical region: Secondary | ICD-10-CM | POA: Diagnosis not present

## 2022-05-16 MED ORDER — MELOXICAM 15 MG PO TABS
15.0000 mg | ORAL_TABLET | Freq: Every day | ORAL | 3 refills | Status: AC
Start: 2022-05-16 — End: ?

## 2022-05-16 MED ORDER — CYCLOBENZAPRINE HCL 5 MG PO TABS: ORAL_TABLET | ORAL | 0 refills | Status: AC

## 2022-05-16 NOTE — Addendum Note (Signed)
Addended by: Basil Dess on: 05/16/2022 10:38 AM   Modules accepted: Orders
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
## 2022-05-16 NOTE — Patient Instructions (Addendum)
Plan: Avoid overhead lifting and overhead use of the arms. Do not lift greater than 5 lbs. Adjust head rest in vehicle to prevent hyperextension if rear ended. Take extra precautions to avoid falling, including use of a cane if you feel weak. Avoid bending, stooping and avoid lifting weights greater than 10 lbs. Avoid prolong standing and walking. Avoid frequent bending and stooping  No lifting greater than 10 lbs. May use ice or moist heat for pain. Weight loss is of benefit. Handicap license is approved. Dr. Junius Finner secretary/Assistant will call to arrange for epidural steroid injection both cervical and lumbar. I will see you back after the ESI's. In the long term you have indicated that you would prefer to follow up with Dr. Melina Schools with [facility] MRI left shoulder ordered due to persistent pain post injection, meloxicam and persisting inspite of home exercises.   Concerning this patients capacity to perform work she is unable to perform gainful employment of a full time basis and I recommend that She be considered totally disabled due to lumbar fusion L4 to S1 with adjacent level disc degeneration and stenosis as well as 3 level cervical fusion with adjacent level C6-7 degenerative disc disease with spondylosis changed below the three level cervical fusion. Further surgery may be contemplated to improve her pain pattern but will not improve her capacity to perform work gainfully. She is permanently fully disabled from returning to Work on this basis.

## 2022-05-20 ENCOUNTER — Other Ambulatory Visit: Payer: Self-pay | Admitting: Specialist

## 2022-05-20 NOTE — Telephone Encounter (Signed)
Cyclobenzaprine is generic name of flexeril and was prescribed and sent to her pharmacy on 10/27. Will note her disability in her last office note from 10/27.

## 2022-05-27 ENCOUNTER — Other Ambulatory Visit: Payer: Self-pay | Admitting: Psychiatry

## 2022-05-27 DIAGNOSIS — F4001 Agoraphobia with panic disorder: Secondary | ICD-10-CM

## 2022-05-27 NOTE — Telephone Encounter (Signed)
Please schedule apt

## 2022-05-27 NOTE — Telephone Encounter (Signed)
LM will Fotini 11/7 at 3:50 to call to schedule appt for the refill. She had left message that she could not get in for appt until late January, but since he is scheduled out that far I told her she still needed an appt.

## 2022-05-28 NOTE — Telephone Encounter (Signed)
Pt called and scheduled an appointment on 09/04/22. Please send in scripts

## 2022-06-16 ENCOUNTER — Other Ambulatory Visit: Payer: Self-pay | Admitting: Internal Medicine

## 2022-07-01 ENCOUNTER — Other Ambulatory Visit: Payer: Self-pay | Admitting: Psychiatry

## 2022-07-02 NOTE — Telephone Encounter (Signed)
Filled 9/16 appt 2/15

## 2022-08-30 ENCOUNTER — Other Ambulatory Visit: Payer: Self-pay | Admitting: Psychiatry

## 2022-08-30 DIAGNOSIS — F4001 Agoraphobia with panic disorder: Secondary | ICD-10-CM

## 2022-09-04 ENCOUNTER — Ambulatory Visit: Payer: Medicare Other | Admitting: Psychiatry

## 2022-09-20 ENCOUNTER — Other Ambulatory Visit: Payer: Self-pay | Admitting: Internal Medicine

## 2022-09-21 ENCOUNTER — Other Ambulatory Visit: Payer: Self-pay | Admitting: Psychiatry

## 2022-10-16 ENCOUNTER — Other Ambulatory Visit: Payer: Self-pay | Admitting: Psychiatry

## 2022-10-23 ENCOUNTER — Encounter: Payer: Self-pay | Admitting: Psychiatry

## 2022-10-23 ENCOUNTER — Ambulatory Visit: Payer: Medicare Other | Admitting: Psychiatry

## 2022-10-23 DIAGNOSIS — F411 Generalized anxiety disorder: Secondary | ICD-10-CM | POA: Diagnosis not present

## 2022-10-23 DIAGNOSIS — F515 Nightmare disorder: Secondary | ICD-10-CM | POA: Diagnosis not present

## 2022-10-23 DIAGNOSIS — F4312 Post-traumatic stress disorder, chronic: Secondary | ICD-10-CM

## 2022-10-23 DIAGNOSIS — F5105 Insomnia due to other mental disorder: Secondary | ICD-10-CM

## 2022-10-23 DIAGNOSIS — F4001 Agoraphobia with panic disorder: Secondary | ICD-10-CM | POA: Diagnosis not present

## 2022-10-23 DIAGNOSIS — F3340 Major depressive disorder, recurrent, in remission, unspecified: Secondary | ICD-10-CM

## 2022-10-23 MED ORDER — DULOXETINE HCL 60 MG PO CPEP: 60.0000 mg | ORAL_CAPSULE | Freq: Two times a day (BID) | ORAL | 3 refills | Status: DC

## 2022-10-23 MED ORDER — LORAZEPAM 1 MG PO TABS: ORAL_TABLET | ORAL | 2 refills | Status: DC

## 2022-10-23 MED ORDER — DOXAZOSIN MESYLATE 4 MG PO TABS: 4.0000 mg | ORAL_TABLET | Freq: Every evening | ORAL | 3 refills | Status: DC
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
## 2022-11-19 ENCOUNTER — Other Ambulatory Visit: Payer: Self-pay | Admitting: Internal Medicine

## 2022-11-24 ENCOUNTER — Encounter: Payer: Self-pay | Admitting: Internal Medicine
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
## 2023-01-06 ENCOUNTER — Encounter: Payer: Self-pay | Admitting: Internal Medicine

## 2023-01-06 ENCOUNTER — Ambulatory Visit: Payer: Medicare Other | Admitting: Internal Medicine

## 2023-01-06 VITALS — BP 132/84 | HR 102 | Ht 67.0 in | Wt 225.6 lb

## 2023-01-06 DIAGNOSIS — J4530 Mild persistent asthma, uncomplicated: Secondary | ICD-10-CM | POA: Diagnosis not present

## 2023-01-06 DIAGNOSIS — J0100 Acute maxillary sinusitis, unspecified: Secondary | ICD-10-CM

## 2023-01-06 MED ORDER — AZITHROMYCIN 250 MG PO TABS: ORAL_TABLET | ORAL | 1 refills | Status: AC

## 2023-01-06 NOTE — Patient Instructions (Signed)
Script sent for Zpak with one refill  Please call if we can help

## 2023-02-13 DIAGNOSIS — J0100 Acute maxillary sinusitis, unspecified: Secondary | ICD-10-CM | POA: Insufficient documentation

## 2023-02-13 NOTE — Assessment & Plan Note (Signed)
Plan Z-Pak with 1 refill, saline rinse

## 2023-02-13 NOTE — Assessment & Plan Note (Signed)
No significant exacerbation Plan-continue current meds

## 2023-02-20 ENCOUNTER — Other Ambulatory Visit: Payer: Self-pay | Admitting: Psychiatry

## 2023-02-20 DIAGNOSIS — F411 Generalized anxiety disorder: Secondary | ICD-10-CM

## 2023-02-20 DIAGNOSIS — F4001 Agoraphobia with panic disorder: Secondary | ICD-10-CM

## 2023-02-25 ENCOUNTER — Encounter: Payer: Self-pay | Admitting: Internal Medicine

## 2023-02-26 NOTE — Telephone Encounter (Signed)
If she is just stuffy and draining, please send astelin nasal spray     1-2 puffs each nostril twice daily as needed. If she thinks she has another sinus infection, send doxycycline 100 mg, # 14, 1 twice daaily

## 2023-03-06 MED ORDER — DOXYCYCLINE HYCLATE 100 MG PO TABS: 100.0000 mg | ORAL_TABLET | Freq: Two times a day (BID) | ORAL | 0 refills | Status: AC

## 2023-03-06 MED ORDER — AZELASTINE HCL 0.1 % NA SOLN: 1.0000 | Freq: Two times a day (BID) | NASAL | 12 refills | Status: AC

## 2023-03-12 NOTE — Telephone Encounter (Signed)
If she agrees to try, I suggest:    1) we can send script for doxycycline 100 mg, # 14, 1 twice daily And- 2) She can try regular use of otc Nasalcrom (cromolyn) nasal spray- directions are on box.

## 2023-03-17 ENCOUNTER — Other Ambulatory Visit: Payer: Self-pay | Admitting: Internal Medicine

## 2023-03-19 NOTE — Telephone Encounter (Signed)
noted 

## 2023-03-19 NOTE — Telephone Encounter (Signed)
Order was placed in chart.

## 2023-03-19 NOTE — Telephone Encounter (Signed)
Sure.  If insurance does not approve, she will need to come in to see someone before being reordered

## 2023-04-20 ENCOUNTER — Ambulatory Visit
Admission: RE | Admit: 2023-04-20 | Discharge: 2023-04-20 | Disposition: A | Discharge status: Home / Self Care | Payer: Medicare Other | Source: Ambulatory Visit | Attending: Physician Assistant

## 2023-04-20 DIAGNOSIS — M7542 Impingement syndrome of left shoulder: Secondary | ICD-10-CM

## 2023-04-27 ENCOUNTER — Telehealth: Payer: Self-pay

## 2023-04-27 NOTE — Progress Notes (Signed)
F/u with xu to discuss

## 2023-04-27 NOTE — Telephone Encounter (Signed)
Called patient. No answer. Ask patient to call us back regarding a follow up with Dr.Xu to discuss her MRI results.

## 2023-08-21 ENCOUNTER — Other Ambulatory Visit: Payer: Self-pay | Admitting: Psychiatry

## 2023-08-21 DIAGNOSIS — F411 Generalized anxiety disorder: Secondary | ICD-10-CM

## 2023-08-21 DIAGNOSIS — F4001 Agoraphobia with panic disorder: Secondary | ICD-10-CM

## 2023-08-24 NOTE — Telephone Encounter (Signed)
Lf 1/2 lv 10/23/22

## 2023-08-24 NOTE — Telephone Encounter (Signed)
Please schedule pt lv 04/04 due back in a year

## 2023-09-20 ENCOUNTER — Other Ambulatory Visit: Payer: Self-pay | Admitting: Internal Medicine

## 2023-09-24 ENCOUNTER — Encounter: Payer: Self-pay | Admitting: Internal Medicine

## 2023-10-21 ENCOUNTER — Other Ambulatory Visit: Payer: Self-pay | Admitting: Psychiatry

## 2023-10-21 DIAGNOSIS — F411 Generalized anxiety disorder: Secondary | ICD-10-CM

## 2023-10-21 DIAGNOSIS — F4001 Agoraphobia with panic disorder: Secondary | ICD-10-CM

## 2023-11-27 ENCOUNTER — Other Ambulatory Visit: Payer: Self-pay | Admitting: Internal Medicine

## 2023-12-11 ENCOUNTER — Other Ambulatory Visit: Payer: Self-pay | Admitting: Psychiatry

## 2023-12-11 DIAGNOSIS — F515 Nightmare disorder: Secondary | ICD-10-CM

## 2024-01-05 ENCOUNTER — Ambulatory Visit: Admitting: Psychiatry

## 2024-01-05 ENCOUNTER — Encounter: Payer: Self-pay | Admitting: Psychiatry

## 2024-01-05 DIAGNOSIS — F515 Nightmare disorder: Secondary | ICD-10-CM

## 2024-01-05 DIAGNOSIS — F5105 Insomnia due to other mental disorder: Secondary | ICD-10-CM

## 2024-01-05 DIAGNOSIS — M5441 Lumbago with sciatica, right side: Secondary | ICD-10-CM

## 2024-01-05 DIAGNOSIS — F4001 Agoraphobia with panic disorder: Secondary | ICD-10-CM | POA: Diagnosis not present

## 2024-01-05 DIAGNOSIS — G8929 Other chronic pain: Secondary | ICD-10-CM

## 2024-01-05 DIAGNOSIS — M25512 Pain in left shoulder: Secondary | ICD-10-CM

## 2024-01-05 DIAGNOSIS — F3340 Major depressive disorder, recurrent, in remission, unspecified: Secondary | ICD-10-CM

## 2024-01-05 DIAGNOSIS — F411 Generalized anxiety disorder: Secondary | ICD-10-CM | POA: Diagnosis not present

## 2024-01-05 DIAGNOSIS — F4312 Post-traumatic stress disorder, chronic: Secondary | ICD-10-CM

## 2024-01-05 DIAGNOSIS — F3341 Major depressive disorder, recurrent, in partial remission: Secondary | ICD-10-CM | POA: Diagnosis not present

## 2024-01-05 DIAGNOSIS — M5442 Lumbago with sciatica, left side: Secondary | ICD-10-CM

## 2024-01-05 MED ORDER — DULOXETINE HCL 60 MG PO CPEP: 60.0000 mg | ORAL_CAPSULE | Freq: Two times a day (BID) | ORAL | 1 refills | Status: DC

## 2024-01-05 MED ORDER — LORAZEPAM 1 MG PO TABS: ORAL_TABLET | ORAL | 5 refills | Status: DC
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
## 2024-03-06 ENCOUNTER — Other Ambulatory Visit: Payer: Self-pay | Admitting: Psychiatry

## 2024-03-06 DIAGNOSIS — F411 Generalized anxiety disorder: Secondary | ICD-10-CM

## 2024-03-06 DIAGNOSIS — F4001 Agoraphobia with panic disorder: Secondary | ICD-10-CM

## 2024-06-29 ENCOUNTER — Other Ambulatory Visit: Payer: Self-pay | Admitting: Psychiatry

## 2024-06-29 DIAGNOSIS — F411 Generalized anxiety disorder: Secondary | ICD-10-CM

## 2024-06-29 DIAGNOSIS — F3340 Major depressive disorder, recurrent, in remission, unspecified: Secondary | ICD-10-CM

## 2024-07-18 ENCOUNTER — Other Ambulatory Visit: Payer: Self-pay | Admitting: Psychiatry

## 2024-07-18 DIAGNOSIS — F4001 Agoraphobia with panic disorder: Secondary | ICD-10-CM

## 2024-07-18 DIAGNOSIS — F411 Generalized anxiety disorder: Secondary | ICD-10-CM
